# Patient Record
Sex: Male | Born: 1940 | Race: White | Hispanic: No
Health system: Midwestern US, Academic
[De-identification: ages and names within clinical notes are randomized; demographics above are authoritative.]

---

## 2016-07-06 ENCOUNTER — Encounter: Admit: 2016-07-06 | Discharge: 2016-07-06 | Payer: MEDICARE

## 2017-03-11 ENCOUNTER — Encounter: Admit: 2017-03-11 | Discharge: 2017-03-11 | Payer: MEDICARE

## 2017-03-11 MED ORDER — ATORVASTATIN 40 MG PO TAB
ORAL_TABLET | Freq: Every day | 1 refills | Status: AC
Start: 2017-03-11 — End: 2017-09-09

## 2017-07-18 ENCOUNTER — Encounter: Admit: 2017-07-18 | Discharge: 2017-07-18 | Payer: MEDICARE

## 2017-07-18 LAB — COMPREHENSIVE METABOLIC PANEL: Lab: 17 — ABNORMAL HIGH (ref 0–14)

## 2017-08-02 ENCOUNTER — Ambulatory Visit: Admit: 2017-08-02 | Discharge: 2017-08-03 | Payer: MEDICARE

## 2017-08-02 ENCOUNTER — Encounter: Admit: 2017-08-02 | Discharge: 2017-08-02 | Payer: MEDICARE

## 2017-08-02 DIAGNOSIS — I10 Essential (primary) hypertension: ICD-10-CM

## 2017-08-02 DIAGNOSIS — I2510 Atherosclerotic heart disease of native coronary artery without angina pectoris: Principal | ICD-10-CM

## 2017-08-02 DIAGNOSIS — E785 Hyperlipidemia, unspecified: ICD-10-CM

## 2017-08-02 DIAGNOSIS — E119 Type 2 diabetes mellitus without complications: ICD-10-CM

## 2017-08-16 ENCOUNTER — Encounter: Admit: 2017-08-16 | Discharge: 2017-08-16 | Payer: MEDICARE

## 2017-09-09 ENCOUNTER — Encounter: Admit: 2017-09-09 | Discharge: 2017-09-09 | Payer: MEDICARE

## 2017-09-09 MED ORDER — ATORVASTATIN 40 MG PO TAB
ORAL_TABLET | Freq: Every day | 3 refills | Status: AC
Start: 2017-09-09 — End: 2018-09-08

## 2018-03-02 ENCOUNTER — Encounter: Admit: 2018-03-02 | Discharge: 2018-03-02 | Payer: MEDICARE

## 2018-03-02 ENCOUNTER — Ambulatory Visit: Admit: 2018-03-02 | Discharge: 2018-03-02 | Payer: MEDICARE

## 2018-03-02 DIAGNOSIS — I10 Essential (primary) hypertension: Secondary | ICD-10-CM

## 2018-03-02 DIAGNOSIS — I2510 Atherosclerotic heart disease of native coronary artery without angina pectoris: Secondary | ICD-10-CM

## 2018-03-02 DIAGNOSIS — E785 Hyperlipidemia, unspecified: Secondary | ICD-10-CM

## 2018-03-02 DIAGNOSIS — E119 Type 2 diabetes mellitus without complications: Secondary | ICD-10-CM

## 2018-03-02 DIAGNOSIS — E7800 Pure hypercholesterolemia, unspecified: Secondary | ICD-10-CM

## 2018-04-04 ENCOUNTER — Encounter: Admit: 2018-04-04 | Discharge: 2018-04-04 | Payer: MEDICARE

## 2018-07-28 ENCOUNTER — Encounter: Admit: 2018-07-28 | Discharge: 2018-07-28

## 2018-09-07 ENCOUNTER — Encounter: Admit: 2018-09-07 | Discharge: 2018-09-07

## 2018-09-08 ENCOUNTER — Encounter: Admit: 2018-09-08 | Discharge: 2018-09-08

## 2018-09-08 MED ORDER — ATORVASTATIN 40 MG PO TAB
ORAL_TABLET | Freq: Every day | 3 refills | Status: DC
Start: 2018-09-08 — End: 2019-08-13

## 2018-09-12 ENCOUNTER — Ambulatory Visit: Admit: 2018-09-12 | Discharge: 2018-09-13

## 2018-09-12 ENCOUNTER — Encounter: Admit: 2018-09-12 | Discharge: 2018-09-12

## 2018-09-12 DIAGNOSIS — I10 Essential (primary) hypertension: Secondary | ICD-10-CM

## 2018-09-12 DIAGNOSIS — E785 Hyperlipidemia, unspecified: Secondary | ICD-10-CM

## 2018-09-12 DIAGNOSIS — I2510 Atherosclerotic heart disease of native coronary artery without angina pectoris: Secondary | ICD-10-CM

## 2018-09-12 DIAGNOSIS — E119 Type 2 diabetes mellitus without complications: Secondary | ICD-10-CM

## 2018-09-12 MED ORDER — AMLODIPINE 2.5 MG PO TAB
2.5 mg | ORAL_TABLET | Freq: Every day | ORAL | 3 refills | Status: DC
Start: 2018-09-12 — End: 2019-08-13

## 2018-09-12 NOTE — Progress Notes
Date of Service: 09/12/2018    Parker Ramos is a 78 y.o. male.       HPI     [name] is followed for coronary artery disease, hypertension, diabetes mellitus and hyperlipidemia.??????His blood pressure has been well controlled in the past although I do not believe that he has been monitoring it recently.  COVID-19 pandemic and the political unrest has been stressful for him recently.  His greatest complaints???continue to be arthritic and consist of bilateral hip pain, chronic lower back discomfort???and discomfort in his right knee. He has seen pain therapy at [facility] and went through a course of warm water therapy without much benefit.??????[name] has not returned to the pain management clinic for follow-up care. ??????He reports that he can walk approximately 100 yards???at a time slowly. ???When he looks at his digital watch he says he walks a total of 1-1.5 miles daily.??????Still it is apparent that he has difficulty transferring and ambulating. ???From a cardiovascular perspective, the patient reports that he has been stable and he reports no angina, current congestive symptoms, palpitations, sensation of sustained forceful heart pounding, lightheadedness or syncope. The patient reports no myalgias or bleeding abnormalities. His blood sugars vary with his diet. ???He does not believe that he has diabetic neuropathy, retinopathy or nephropathy.???  ???  Historically, in the fall of 2007 [name] initially presented with symptoms of lightheadedness and a rapid heart beat. A stress test was performed and   was abnormal. He then underwent coronary angiography which revealed severe three-vessel coronary artery disease. He   underwent cardiac bypass surgery receiving a left internal mammary artery graft to the left anterior descending along   with a free right internal mammary artery T-graft off the LIMA to the diagonal and obtuse marginal branch of the   circumflex coronary artery. He also received a reverse saphenous vein graft to the posterior descending branch of the   right coronary artery. This was performed on November 08, 2005. The surgery was tolerated well and was uncomplicated. ???  In 2010 [name] underwent cardiac evaluation prior to left knee surgery. Coronary angiography was performed on 06/18/08 and revealed: ???  1. Severe multivessel native coronary artery disease.  2. Patent saphenous vein graft to PDA.  3. Patent LIMA to LAD with patent jump segments to the diagonal and obtuse marginal.  4. Normal left ventricular systolic function.     [name] developed left facial numbness rather abruptly on 06/07/11 at approximately 10:45 AM. It lasted until approximately 3:30 PM and it was not associated with motor abnormalities, visual abnormalities, hearing abnormalities, cognitive abnormalities or difficulty with speech. The patient reports that a CT scan, carotid duplex and MRA were performed and that a transient ischemic attack was suspected. He reports that he was seen by a neurologist who placed him on Plavix instead of aspirin. 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 He reports that he developed dyspnea on 04/14/12 and was seen in the Emergency Room. He was noted to have pulmonary vascular congestion and reportedly was administered IV furosemide with improvement and his hydrochlorothiazide was restarted. [name] does have a history of diabetes mellitus and transient ischemic attack.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 He reports that he developed hypotension after receiving Flomax and spent a night in the intensive care unit but reports no other complications such as chest discomfort, congestive heart failure or arrhythmias. On July 16, 2017  [name] woke up with mid abdominal pain and nausea. ???He had occasional episodes of emesis. ???The patient???reports that he was evaluated on 07/18/2017 and was sent to the emergency room. ???There is no evidence for an acute coronary syndrome. ???The source of the discomfort was thought to be abdominal and he was placed on an H2 blocker (famotidine) with some gradual improvement.          Vitals:    09/12/18 1014 09/12/18 1022   BP: (!) 182/84 (!) 178/82   BP Source: Arm, Left Upper Arm, Right Upper   Pulse: 72    Temp: 36.2 ???C (97.2 ???F)    SpO2: 98%    Weight: 89.8 kg (198 lb)    Height: 1.727 m (5' 8)    PainSc: Zero      Body mass index is 30.11 kg/m???.     Past Medical History  Patient Active Problem List    Diagnosis Date Noted   ??? 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 Polyarthralgia 09/01/2014   ??? TIA (transient ischemic attack) 08/04/2011   ??? Palpitation 08/15/2008     Looping Event Monitor:  23 transmissions with no symptoms reported.  It was either a baseline recording or random recording.  The patient's monitor demonstrated sinus rhythm with sinus bradycardia and frequent PVCs of different morphology with, at times, couplets or bigeminal rhythm.  There also is separate ventricular beats noted at a rate of about 40-44 beats per minute.  These were followed by PVCs and it appears to be competing with the sinus rhythm, maybe due to increased refractory period after the PVC.  No symptoms reported during these beats.     ??? CAD (coronary artery disease) 06/17/2008     A.  11/02/2005 adenosine thallium stress test abnormal with reversible apical anterior perfusion abnormality. Abnormal ECG.  EF 60%.  b.     11/05/2005 cardiac cath left main normal.  LAD tubular 60 percent proximal stenosis and first diagonal 80 percent ostial stenosis.  Left   circumflex 80 percent ostial stenosis followed by 30 percent mid stenosis.  RCA mid 70 percent stenosis. PLV moderate stenosis.  EF  55 percent.  Mild apical lateral hypokinesis.  c.     11/08/2005 coronary artery bypass with LIMA to LAD, a free RIMA as T-graft off LIMA to diagonal, and then sequential to obtuse marginal branch of circumflex. Separate SVG to right PDA.  D. 06/06/08: Lexicon stress Spect- [facility]: mild intensity ischemia involving portions of the [place] anterolateral and lateral wall. EF 55%  E. 06/20/08: Cardiac cath at [place]: LM- mild plaque, LAD- Prox-mid 80-99%, LCx: 80 % proximal/ostial circumflex, which spans the takeoff of the first  small [place] obtuse marginal branch. 60 % midportion of the circumflex. RCA: 100 % occluded distally, diffuse up to 50 % stenosis in the  proximal and mid segments of RCA.EF 55%. Patent LIMA to LAD, Patent SVG to PDA. EDP 14-16 mm Hg     ??? 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       Separate SVG to right PDA.       ??? Type II diabetes mellitus (HCC) 06/17/2008   ??? HTN (hypertension) 06/17/2008   ??? HLD (hyperlipidemia) 06/17/2008  Review of Systems   Constitution: Negative.   HENT: Negative.    Eyes: Negative.    Cardiovascular: Negative.    Respiratory: Negative.    Endocrine: Negative.    Hematologic/Lymphatic: Negative.    Skin: Negative.    Musculoskeletal: Negative.    Gastrointestinal: Negative.    Genitourinary: Negative.    Neurological: Negative.    Psychiatric/Behavioral: Negative.    Allergic/Immunologic: Negative.        Physical Exam  GENERAL: The patient is well developed, well nourished, resting comfortably and in no distress. ???  HEENT: No abnormalities of the visible oro-nasopharynx, conjunctiva or sclera are noted. ???  NECK: There is no jugular venous distension. Carotids are palpable and without bruits. There is no thyroid enlargement. ???  Chest: Lung fields are clear to auscultation. There are no wheezes or crackles. ???  CV: There is a regular rhythm. The first and second heart sounds are normal. There are no murmurs, gallops or rubs. His heart rate is 68???BPM. ???I took his blood pressure myself and it was 160/70 mmHg in his left arm seated after he had been sitting for a while.  ABD: The abdomen is soft and supple with normal bowel sounds. There is no hepatosplenomegaly, ascites, tenderness, masses or bruits. ???  Neuro: There are no focal motor defects. Ambulation is slow. Arthritic discomfort makes transferring slow. Cognitive function appears normal. ???  Ext:???There is no edema or evidence of deep vein thrombosis    Cardiovascular Studies  A 12-lead ECG was obtained on 12/2018 shows normal sinus rhythm with a heart rate of 67 bpm.  There is no evidence for myocardial ischemia or infarction.  Labs from 07/04/2018 revealed serum potassium 4.8 mmol/L and serum creatinine 0.89 mg/dL.  His ALT = 16.  His total cholesterol was 105, triglycerides 54, HDL 38 and LDL cholesterol 56 mg/dL.    Problems Addressed Today  Coronary artery disease.  Hypertension.  Hypercholesterolemia.  Assessment and Plan     [name] appears stable from a cardiovascular perspective. His blood pressure was elevated today,  and I do not believe that he has been monitoring his blood pressure at home most recently.   Alternatives for the treatment of hypertension were reviewed with the patient and he wanted to start amlodipine 2.5 mg daily in addition to more intense salt restriction.  Possible adverse effects associated with amlodipine were extensively reviewed with the patient.  I have asked the patient to keep a log book of his BP readings and to report systolic BP readings exceeding 130 mm Hg.  I have asked him to report his blood pressure readings to our office in 1 month regardless. Regular mild aerobic exercise, weight loss and adherence to a heart healthy diabetic diet were recommended. I have asked the patient to carry sublingual nitroglycerin at all times. ???I have asked him to work on diabetic dietary compliance and diabetic control.???He indicates that he obtains his labs from his primary care physician. I have asked [name] to return for follow-up in???3 months.         Current Medications (including today's revisions)  ??? amLODIPine (NORVASC) 2.5 mg tablet Take one tablet by mouth daily.   ??? amoxicillin (AMOXIL) 500 mg capsule TAKE 4 CAPSULES BY MOUTH 1 HOUR PRIOR TO DENTAL PROCEDURE   ??? atorvastatin (LIPITOR) 40 mg tablet TAKE 1 TABLET EVERY DAY   ??? CALCIUM CARBONATE/VITAMIN D3 (CALCIUM + D PO) Take 1 Tab by mouth Daily.   ??? clopiDOGrel (PLAVIX) 75  mg tablet Take 75 mg by mouth daily.     ??? famotidine(+) (PEPCID) 40 mg tablet Take 40 mg by mouth daily.   ??? glimepiride (AMARYL) 1 mg tablet Take 1 mg by mouth twice daily.   ??? levothyroxine (SYNTHROID) 25 mcg tablet Take 25 mcg by mouth daily.   ??? metformin (GLUCOPHAGE) 1,000 mg tablet Take 1 Tab by mouth Twice Daily With Meals. DO NOT RESUME UNTIL THE EVENING OF MAY 20TH   ??? metoprolol (LOPRESSOR) 25 mg tablet Take 0.5 Tabs by mouth Twice Daily.   ??? nitroglycerin (NITROSTAT) 0.4 mg tablet Place 1 Tab under tongue every 5 minutes as needed for Chest Pain.

## 2018-09-12 NOTE — Patient Instructions
It was nice to see you today.      We discussed:   Please call our office in a couple weeks with your blood pressure readings.  Our nursing number is [phone]

## 2018-09-29 ENCOUNTER — Encounter: Admit: 2018-09-29 | Discharge: 2018-09-29

## 2018-09-29 NOTE — Telephone Encounter
Pt called to report BP readings.  BP Readings are as follows:    161/91, 143/84  122/67, 133/70  110/61, 108/57  <after starting medication>  137/72  132/59  149/72  132/68  133/71  134/70  127/74  139/73  126/71  129/69  126/70  118/69  124/71  134/78  129/66  115/64  111/63    Pt states that he is feeling okay.  He states he still has aches and pains.  He denies any chest pain, shortness of breath, headache or palpitations.  Pt confirmed he is taking medication as prescribed.    Will route to Dr. Jacklyn Shell for any additional recommendations.

## 2018-10-03 NOTE — Telephone Encounter
Called and discussed recommendations with pt's daughter.  She verbalized understanding.  No further needs identified.  Will callback with any questions, concerns or problems.

## 2018-10-03 NOTE — Telephone Encounter
Parker Massed, MD  Baldwin Crown, RN; Pat Kocher, RN    Caller: Unspecified (4 days ago, 3:47 PM)              To all: Blood pressure readings look improved. Please let him know. Thanks. SBG

## 2018-12-19 ENCOUNTER — Encounter: Admit: 2018-12-19 | Discharge: 2018-12-19 | Payer: MEDICARE

## 2018-12-19 DIAGNOSIS — I2510 Atherosclerotic heart disease of native coronary artery without angina pectoris: Secondary | ICD-10-CM

## 2018-12-19 DIAGNOSIS — E785 Hyperlipidemia, unspecified: Secondary | ICD-10-CM

## 2018-12-19 DIAGNOSIS — I10 Essential (primary) hypertension: Secondary | ICD-10-CM

## 2018-12-19 DIAGNOSIS — E119 Type 2 diabetes mellitus without complications: Secondary | ICD-10-CM

## 2018-12-19 NOTE — Progress Notes
Date of Service: 12/19/2018    Parker Ramos is a 78 y.o. male.       HPI     Parker Ramos is followed for coronary artery disease, hypertension, diabetes mellitus and hyperlipidemia.??When I saw him in the office on September 12, 2018 his blood pressure was quite elevated because he was being upset by the COVID-19 pandemic and associated political unrest.  Amlodipine 2.5 mg daily was added to his medical regimen and he has tolerated this without difficulty.  However, he is still not been monitoring his blood pressures. His greatest complaints?continue to be arthritic and consist of bilateral hip pain, chronic lower back discomfort?and discomfort in his right knee. He has seen pain therapy at [facility] and went through a course of warm water therapy without much benefit.??Parker Ramos has not returned to the pain management clinic for follow-up care. ??He reports that he can walk approximately 100 yards?at a time slowly. ?When he looks at his digital watch he says he walks a total of 1-1.5 miles daily.??Still it is apparent that he has difficulty transferring and ambulating. ?From a cardiovascular perspective, the patient reports that he has been stable and he reports no angina, current congestive symptoms, palpitations, sensation of sustained forceful heart pounding, lightheadedness or syncope. The patient reports no myalgias or bleeding abnormalities. His blood sugars vary with his diet.??He does not believe that he has diabetic neuropathy, retinopathy or nephropathy.?  ?  Historically, in the fall of 2007 Parker Ramos initially presented with symptoms of lightheadedness and a rapid heart beat. A stress test was performed and   was abnormal. He then underwent coronary angiography which revealed severe three-vessel coronary artery disease. He   underwent cardiac bypass surgery receiving a left internal mammary artery graft to the left anterior descending along with a free right internal mammary artery T-graft off the LIMA to the diagonal and obtuse marginal branch of the   circumflex coronary artery. He also received a reverse saphenous vein graft to the posterior descending branch of the   right coronary artery. This was performed on November 08, 2005. The surgery was tolerated well and was uncomplicated. ?  In 2010 Parker Ramos underwent cardiac evaluation prior to left knee surgery. Coronary angiography was performed on 06/18/08 and revealed: ?  1. Severe multivessel native coronary artery disease.  2. Patent saphenous vein graft to PDA.  3. Patent LIMA to LAD with patent jump segments to the diagonal and obtuse marginal.  4. Normal left ventricular systolic function.   ? Parker Ramos developed left facial numbness rather abruptly on 06/07/11 at approximately 10:45 AM. It lasted until approximately 3:30 PM and it was not associated with motor abnormalities, visual abnormalities, hearing abnormalities, cognitive abnormalities or difficulty with speech. The patient reports that a CT scan, carotid duplex and MRA were performed and that a transient ischemic attack was suspected. He reports that he was seen by a neurologist who placed him on Plavix instead of aspirin. 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 He reports that he developed dyspnea on 04/14/12 and was seen in the Emergency Room. He was noted to have pulmonary vascular congestion and reportedly was administered IV furosemide with improvement and his hydrochlorothiazide was restarted. 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 He reports that he developed hypotension after receiving Flomax and spent a night in the intensive care  unit but reports no other complications such as chest discomfort, congestive heart failure or arrhythmias.?On?July 16, 2017?Parker Ramos?woke up with mid abdominal pain and nausea. ?He had occasional episodes of emesis. ?The patient?reports that he was evaluated on 07/18/2017 and was sent to the emergency room. ?There is no evidence for an acute coronary syndrome. ?The source of the discomfort was thought to be abdominal and he was placed on an H2 blocker (famotidine) with some gradual improvement.?         Vitals:    12/19/18 0956   BP: 138/80   BP Source: Arm, Left Upper   Pulse: 68   Temp: 36.6 ?C (97.9 ?F)   SpO2: 98%   Weight: 90.4 kg (199 lb 3.2 oz)   Height: 1.727 m (5' 8)   PainSc: Zero     Body mass index is 30.29 kg/m?Marland Kitchen     Past Medical History  Patient Active Problem List    Diagnosis Date Noted ? 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 Polyarthralgia 09/01/2014   ? TIA (transient ischemic attack) 08/04/2011   ? Palpitation 08/15/2008     Looping Event Monitor:  23 transmissions with no symptoms reported.  It was either a baseline recording or random recording.  The patient's monitor demonstrated sinus rhythm with sinus bradycardia and frequent PVCs of different morphology with, at times, couplets or bigeminal rhythm.  There also is separate ventricular beats noted at a rate of about 40-44 beats per minute.  These were followed by PVCs and it appears to be competing with the sinus rhythm, maybe due to increased refractory period after the PVC.  No symptoms reported during these beats.     ? CAD (coronary artery disease) 06/17/2008     A.  11/02/2005 adenosine thallium stress test abnormal with reversible apical anterior perfusion abnormality. Abnormal ECG.  EF 60%.  b.     11/05/2005 cardiac cath left main normal.  LAD tubular 60 percent proximal stenosis and first diagonal 80 percent ostial stenosis.  Left   circumflex 80 percent ostial stenosis followed by 30 percent mid stenosis.  RCA mid 70 percent stenosis. PLV moderate stenosis.  EF  55 percent.  Mild apical lateral hypokinesis.  c.     11/08/2005 coronary artery bypass with LIMA to LAD, a free RIMA as T-graft off LIMA to diagonal, and then sequential to obtuse marginal  branch of circumflex. Separate SVG to right PDA.  D. 06/06/08: [facility]icon stress Spect- [facility]: mild intensity ischemia involving portions of the [place] anterolateral and lateral wall. EF 55% E. 06/20/08: Cardiac cath at [place]: LM- mild plaque, LAD- Prox-mid 80-99%, LCx: 80 % proximal/ostial circumflex, which spans the takeoff of the first  small [place] obtuse marginal branch. 60 % midportion of the circumflex. RCA: 100 % occluded distally, diffuse up to 50 % stenosis in the  proximal and mid segments of RCA.EF 55%. Patent LIMA to LAD, Patent SVG to PDA. EDP 14-16 mm Hg     ? 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       Separate SVG to right PDA.       ? Type II diabetes mellitus (HCC) 06/17/2008   ? HTN (hypertension) 06/17/2008   ? HLD (hyperlipidemia) 06/17/2008  Review of Systems   Constitution: Negative.   HENT: Negative.    Eyes: Negative.    Cardiovascular: Positive for leg swelling.   Respiratory: Negative.    Endocrine: Negative.    Hematologic/Lymphatic: Negative.    Skin: Negative.    Musculoskeletal: Positive for arthritis, back pain and joint pain.   Gastrointestinal: Negative.    Genitourinary: Negative.    Neurological: Positive for dizziness and loss of balance.   Psychiatric/Behavioral: Negative.    Allergic/Immunologic: Negative.        Physical Exam  GENERAL: The patient is well developed, well nourished, resting comfortably and in no distress.   HEENT: No abnormalities of the visible oro-nasopharynx, conjunctiva or sclera are noted.  NECK: There is no jugular venous distension. Carotids are palpable and without bruits. There is no thyroid enlargement.  Chest: Lung fields are clear to auscultation. There are no wheezes or crackles.  CV: There is a regular rhythm. The first and second heart sounds are normal. There are no murmurs, gallops or rubs.  ABD: The abdomen is soft and supple with normal bowel sounds. There is no hepatosplenomegaly, ascites, tenderness, masses or bruits. Neuro: There are no focal motor defects. Ambulation is normal. Cognitive function appears normal.  Ext: There is no edema or evidence of deep vein thrombosis. Peripheral pulses are satisfactory.    SKIN: There are no rashes and no cellulitis  PSYCH: The patient is calm, rationale and oriented.    Cardiovascular Studies  A 12-lead ECG was obtained on 09/12/2018 shows normal sinus rhythm with a heart rate of 67 bpm.  There is no evidence for myocardial ischemia or infarction.  A twelve-lead ECG obtained 12/19/2018 shows normal sinus rhythm with a heart rate of 61 bpm.  There is no evidence of myocardial ischemia or infarction.  Labs from 07/04/2018 revealed serum potassium 4.8 mmol/L and serum creatinine 0.89 mg/dL.  His ALT = 16.  His total cholesterol was 105, triglycerides 54, HDL 38 and LDL cholesterol 56 mg/dL.     Problems Addressed Today  Coronary artery disease.  Hypertension.  Hypercholesterolemia.  Assessment and Plan  Parker Ramos appears stable from a cardiovascular perspective.?His blood pressure appears much better controlled he has been trying to avoid arguments with people concerning Covid and politics.  I have asked the patient to keep a log book of his BP readings and to report BP readings exceeding 130/80?mm Hg. Regular mild aerobic exercise, weight loss and adherence to a heart healthy diabetic diet were recommended. I have asked the patient to carry sublingual nitroglycerin at all times. ?I have asked him to work on diabetic dietary compliance and diabetic control.?He indicates that he obtains his labs from his primary care physician. I have asked Parker Ramos to return for follow-up in?6 months.         Current Medications (including today's revisions)  ? amLODIPine (NORVASC) 2.5 mg tablet Take one tablet by mouth daily.   ? amoxicillin (AMOXIL) 500 mg capsule TAKE 4 CAPSULES BY MOUTH 1 HOUR PRIOR TO DENTAL PROCEDURE   ? atorvastatin (LIPITOR) 40 mg tablet TAKE 1 TABLET EVERY DAY ? CALCIUM CARBONATE/VITAMIN D3 (CALCIUM + D PO) Take 1 Tab by mouth Daily.   ? clopiDOGrel (PLAVIX) 75 mg tablet Take 75 mg by mouth daily.     ? famotidine(+) (PEPCID) 40 mg tablet Take 40 mg by mouth daily.   ? glimepiride (AMARYL) 1 mg tablet Take 2 mg by mouth twice daily.   ? levothyroxine (SYNTHROID) 25 mcg  tablet Take 25 mcg by mouth daily.   ? metformin (GLUCOPHAGE) 1,000 mg tablet Take 1 Tab by mouth Twice Daily With Meals. DO NOT RESUME UNTIL THE EVENING OF MAY 20TH   ? metoprolol (LOPRESSOR) 25 mg tablet Take 0.5 Tabs by mouth Twice Daily.   ? nitroglycerin (NITROSTAT) 0.4 mg tablet Place 1 Tab under tongue every 5 minutes as needed for Chest Pain.

## 2019-06-12 ENCOUNTER — Encounter: Admit: 2019-06-12 | Discharge: 2019-06-12 | Payer: MEDICARE

## 2019-06-12 DIAGNOSIS — E785 Hyperlipidemia, unspecified: Secondary | ICD-10-CM

## 2019-06-12 DIAGNOSIS — I2510 Atherosclerotic heart disease of native coronary artery without angina pectoris: Secondary | ICD-10-CM

## 2019-06-12 DIAGNOSIS — I10 Essential (primary) hypertension: Secondary | ICD-10-CM

## 2019-06-12 DIAGNOSIS — E119 Type 2 diabetes mellitus without complications: Secondary | ICD-10-CM

## 2019-06-12 NOTE — Patient Instructions
Follow up as directed.  Call sooner if issues.  Call the [facility] nursing line at [phone].  Leave a detailed message for the nurse in [facility] with how we can assist you and we will call you back.

## 2019-06-12 NOTE — Progress Notes
Date of Service: 06/12/2019    Parker Ramos is a 79 y.o. male.       HPI     [name] is followed for coronary artery disease, hypertension, diabetes mellitus and hyperlipidemia.  His blood pressure tends to increase when he is upset, especially about politics.  Over the past 4 months he has been less upset by politics and his blood pressure has been much better controlled.  Almost all of his current symptoms are related to arthritis.  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  He has seen pain therapy at [facility] and went through a course of warm water therapy without much benefit.??[name] has not returned to the pain management clinic for follow-up care. ??He reports that he can walk approximately 100 yards?at a time?slowly.  It is apparent that he has difficulty transferring and ambulating. ?From a cardiovascular perspective, the patient reports that he has been stable and he reports no angina, current congestive symptoms, palpitations, sensation of sustained forceful heart pounding, lightheadedness or syncope. The patient reports no myalgias or bleeding abnormalities. His blood sugars vary with his diet.??He does not believe that he has diabetic neuropathy, retinopathy or nephropathy.?  ?  Historically, in the fall of 2007 [name] initially presented with symptoms of lightheadedness and a rapid heart beat. A stress test was performed and   was abnormal. He then underwent coronary angiography which revealed severe three-vessel coronary artery disease. He   underwent cardiac bypass surgery receiving a left internal mammary artery graft to the left anterior descending along   with a free right internal mammary artery T-graft off the LIMA to the diagonal and obtuse marginal branch of the   circumflex coronary artery. He also received a reverse saphenous vein graft to the posterior descending branch of the   right coronary artery. This was performed on November 08, 2005. The surgery was tolerated well and was uncomplicated. ?  In 2010 [name] underwent cardiac evaluation prior to left knee surgery. Coronary angiography was performed on 06/18/08 and revealed: ?  1. Severe multivessel native coronary artery disease.  2. Patent saphenous vein graft to PDA.  3. Patent LIMA to LAD with patent jump segments to the diagonal and obtuse marginal.  4. Normal left ventricular systolic function.   ?  [name] developed left facial numbness rather abruptly on 06/07/11 at approximately 10:45 AM. It lasted until approximately 3:30 PM and it was not associated with motor abnormalities, visual abnormalities, hearing abnormalities, cognitive abnormalities or difficulty with speech. The patient reports that a CT scan, carotid duplex and MRA were performed and that a transient ischemic attack was suspected. He reports that he was seen by a neurologist who placed him on Plavix instead of aspirin. 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 He reports that he developed dyspnea on 04/14/12 and was seen in the Emergency Room. He was noted to have pulmonary vascular congestion and reportedly was administered IV furosemide with improvement and his hydrochlorothiazide was restarted. [name] does have a history of diabetes mellitus and transient ischemic attack.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 He reports that he developed hypotension after receiving Flomax and spent a night in the intensive care unit but reports no other complications such as chest discomfort, congestive heart failure or arrhythmias.?On?July 16, 2017?[name]?woke up with mid abdominal pain and nausea. ?He had occasional episodes of emesis. ?The patient?reports that  he was evaluated on 07/18/2017 and was sent to the emergency room. ?There is no evidence for an acute coronary syndrome. ?The source of the discomfort was thought to be abdominal and he was placed on an H2 blocker (famotidine) with some gradual improvement.?         Vitals:    06/12/19 1351 06/12/19 1406   BP: 130/68 120/72   BP Source: Arm, Left Upper Arm, Right Upper   Patient Position: Sitting Sitting   Pulse: 74    SpO2: 98%    Weight: 90.4 kg (199 lb 6.4 oz)    Height: 1.727 m (5' 8)    PainSc: Six      Body mass index is 30.32 kg/m?Marland Kitchen     Past Medical History  Patient Active Problem List    Diagnosis Date Noted   ? 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 Polyarthralgia 09/01/2014   ? TIA (transient ischemic attack) 08/04/2011   ? Palpitation 08/15/2008     Looping Event Monitor:  23 transmissions with no symptoms reported.  It was either a baseline recording or random recording.  The patient's monitor demonstrated sinus rhythm with sinus bradycardia and frequent PVCs of different morphology with, at times, couplets or bigeminal rhythm.  There also is separate ventricular beats noted at a rate of about 40-44 beats per minute.  These were followed by PVCs and it appears to be competing with the sinus rhythm, maybe due to increased refractory period after the PVC.  No symptoms reported during these beats.     ? CAD (coronary artery disease) 06/17/2008     A.  11/02/2005 adenosine thallium stress test abnormal with reversible apical anterior perfusion abnormality. Abnormal ECG.  EF 60%.  b.     11/05/2005 cardiac cath left main normal.  LAD tubular 60 percent proximal stenosis and first diagonal 80 percent ostial stenosis.  Left   circumflex 80 percent ostial stenosis followed by 30 percent mid stenosis.  RCA mid 70 percent stenosis. PLV moderate stenosis.  EF  55 percent.  Mild apical lateral hypokinesis.  c.     11/08/2005 coronary artery bypass with LIMA to LAD, a free RIMA as T-graft off LIMA to diagonal, and then sequential to obtuse marginal  branch of circumflex. Separate SVG to right PDA.  D. 06/06/08: Lexicon stress Spect- [facility]: mild intensity ischemia involving portions of the [place] anterolateral and lateral wall. EF 55%  E. 06/20/08: Cardiac cath at [facility]: LM- mild plaque, LAD- Prox-mid 80-99%, LCx: 80 % proximal/ostial circumflex, which spans the takeoff of the first  small [place] obtuse marginal branch. 60 % midportion of the circumflex. RCA: 100 % occluded distally, diffuse up to 50 % stenosis in the  proximal and mid segments of RCA.EF 55%. Patent LIMA to LAD, Patent SVG to PDA. EDP 14-16 mm Hg     ? 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       Separate SVG to right PDA.       ? Type II diabetes mellitus (HCC) 06/17/2008   ? HTN (hypertension) 06/17/2008   ? HLD (hyperlipidemia) 06/17/2008         Review of Systems   Constitution: Negative.   HENT: Negative.    Eyes: Positive  for blurred vision and vision loss in right eye.   Cardiovascular: Positive for dyspnea on exertion.   Respiratory: Negative.    Endocrine: Negative.    Hematologic/Lymphatic: Negative.    Skin: Negative.    Musculoskeletal: Positive for arthritis, joint pain and muscle weakness.   Gastrointestinal: Negative.    Genitourinary: Negative.    Neurological: Positive for light-headedness and weakness.   Psychiatric/Behavioral: Negative.    Allergic/Immunologic: Negative.        Physical Exam  GENERAL: The patient is well developed, well nourished, resting comfortably and in no distress.   HEENT: No abnormalities of the visible oro-nasopharynx, conjunctiva or sclera are noted.  NECK: There is no jugular venous distension. Carotids are palpable and without bruits. There is no thyroid enlargement.  Chest: Lung fields are clear to auscultation. There are no wheezes or crackles.  CV: There is a regular rhythm. The first and second heart sounds are normal. There are no murmurs, gallops or rubs.  ABD: The abdomen is soft and supple with normal bowel sounds. There is no hepatosplenomegaly, ascites, tenderness, masses or bruits.  Neuro: There are no focal motor defects. Ambulation is slow and stooped.  Cognitive function appears normal.  He has difficulty transferring due to arthritis in multiple joints.  Ext: There is no edema or evidence of deep vein thrombosis. Peripheral pulses are satisfactory.    SKIN: There are no rashes and no cellulitis  PSYCH: The patient is calm, rationale and oriented.    Cardiovascular Studies  A 12-lead ECG was obtained on 09/12/2018 shows normal sinus rhythm with a heart rate of 67 bpm. ?There is no evidence for myocardial ischemia or infarction.  A twelve-lead ECG obtained 12/19/2018 shows normal sinus rhythm with a heart rate of 61 bpm.  There is no evidence of myocardial ischemia or infarction.  Labs from 07/04/2018 revealed serum potassium 4.8 mmol/L and serum creatinine 0.89 mg/dL. ?His ALT = 16. ?His total cholesterol was 105, triglycerides 54, HDL 38 and LDL cholesterol 56 mg/dL.    Problems Addressed Today  Encounter Diagnoses   Name Primary?   ? Coronary artery disease involving native heart without angina pectoris, unspecified vessel or lesion type Yes   ? Essential hypertension        Assessment and Plan     [name] appears stable from a cardiovascular perspective.?His blood pressure appears much better controlled he has been trying to avoid arguments with people concerning Covid and politics.  I have asked the patient to keep a log book of his BP readings and to report BP readings exceeding 130/80?mm Hg. Regular mild aerobic exercise, weight loss and adherence to a heart healthy diabetic diet were recommended. I have asked the patient to carry sublingual nitroglycerin at all times. ?I have asked him to work on diabetic dietary compliance and diabetic control.?He indicates that he obtains his labs from his primary care physician.  I urged him to return to the spine center and to undergo further arthritic evaluation, which he politely declined.  I have asked [name] to return for follow-up in?6?months.         Current Medications (including today's revisions)  ? acetaminophen SR (TYLENOL) 650 mg tablet Take 650 mg by mouth every 6 hours as needed for Pain.   ? amLODIPine (NORVASC) 2.5 mg tablet Take one tablet by mouth daily.   ? amoxicillin (AMOXIL) 500 mg capsule TAKE 4 CAPSULES BY MOUTH 1 HOUR PRIOR TO DENTAL PROCEDURE   ? atorvastatin (LIPITOR)  40 mg tablet TAKE 1 TABLET EVERY DAY   ? CALCIUM CARBONATE/VITAMIN D3 (CALCIUM + D PO) Take 1 tablet by mouth twice daily.   ? clopiDOGrel (PLAVIX) 75 mg tablet Take 75 mg by mouth daily.     ? famotidine(+) (PEPCID) 40 mg tablet Take 40 mg by mouth daily.   ? glimepiride (AMARYL) 2 mg tablet Take 1 tablet by mouth twice daily.   ? levothyroxine (SYNTHROID) 25 mcg tablet Take 25 mcg by mouth daily.   ? metformin (GLUCOPHAGE) 1,000 mg tablet Take 1 Tab by mouth Twice Daily With Meals. DO NOT RESUME UNTIL THE EVENING OF MAY 20TH   ? metoprolol (LOPRESSOR) 25 mg tablet Take 0.5 Tabs by mouth Twice Daily.   ? nitroglycerin (NITROSTAT) 0.4 mg tablet Place 1 Tab under tongue every 5 minutes as needed for Chest Pain.   ? traMADoL (ULTRAM) 50 mg tablet Take 1 tablet by mouth daily.

## 2019-08-13 ENCOUNTER — Encounter: Admit: 2019-08-13 | Discharge: 2019-08-13 | Payer: MEDICARE

## 2019-08-13 MED ORDER — ATORVASTATIN 40 MG PO TAB
ORAL_TABLET | Freq: Every day | 3 refills | Status: AC
Start: 2019-08-13 — End: ?

## 2019-08-13 MED ORDER — AMLODIPINE 2.5 MG PO TAB
ORAL_TABLET | Freq: Every day | 3 refills | Status: AC
Start: 2019-08-13 — End: ?

## 2019-12-11 ENCOUNTER — Encounter: Admit: 2019-12-11 | Discharge: 2019-12-11 | Payer: MEDICARE

## 2019-12-11 DIAGNOSIS — I2510 Atherosclerotic heart disease of native coronary artery without angina pectoris: Secondary | ICD-10-CM

## 2019-12-11 DIAGNOSIS — E119 Type 2 diabetes mellitus without complications: Secondary | ICD-10-CM

## 2019-12-11 DIAGNOSIS — I10 Essential (primary) hypertension: Secondary | ICD-10-CM

## 2019-12-11 DIAGNOSIS — E785 Hyperlipidemia, unspecified: Secondary | ICD-10-CM

## 2019-12-11 DIAGNOSIS — M1990 Unspecified osteoarthritis, unspecified site: Secondary | ICD-10-CM

## 2019-12-11 NOTE — Progress Notes
Date of Service: 12/11/2019    ZURI MERRIMAN is a 79 y.o. male.       HPI     Mr. Joven Smeby is followed for coronary artery disease, hypertension, diabetes mellitus and hyperlipidemia.    His blood pressure has not been well controlled in recent months. Almost all of his current symptoms are related to arthritis.  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  He has seen pain therapy at [facility] and went through a course of warm water therapy without much benefit.??Mr. Moreaux has not returned to the pain management clinic for follow-up care. ??His exercise tolerance is slowly worsening due to his arthritis.  It is apparent that he has difficulty transferring and ambulating. ?From a cardiovascular perspective, the patient reports that he has been stable and he reports no angina, current congestive symptoms, palpitations, sensation of sustained forceful heart pounding, lightheadedness or syncope. The patient reports no myalgias or bleeding abnormalities. His blood sugars vary with his diet.??He does not believe that he has diabetic neuropathy, retinopathy or nephropathy.?  ?  Historically, in the fall of 2007 Mr. Liggins initially presented with symptoms of lightheadedness and a rapid heart beat. A stress test was performed and   was abnormal. He then underwent coronary angiography which revealed severe three-vessel coronary artery disease. He   underwent cardiac bypass surgery receiving a left internal mammary artery graft to the left anterior descending along   with a free right internal mammary artery T-graft off the LIMA to the diagonal and obtuse marginal branch of the   circumflex coronary artery. He also received a reverse saphenous vein graft to the posterior descending branch of the   right coronary artery. This was performed on November 08, 2005. The surgery was tolerated well and was uncomplicated. ?  In 2010 Mr. Moon underwent cardiac evaluation prior to left knee surgery. Coronary angiography was performed on 06/18/08 and revealed: ?  1. Severe multivessel native coronary artery disease.  2. Patent saphenous vein graft to PDA.  3. Patent LIMA to LAD with patent jump segments to the diagonal and obtuse marginal.  4. Normal left ventricular systolic function.   ?  Mr. Zuehlke developed left facial numbness rather abruptly on 06/07/11 at approximately 10:45 AM. It lasted until approximately 3:30 PM and it was not associated with motor abnormalities, visual abnormalities, hearing abnormalities, cognitive abnormalities or difficulty with speech. The patient reports that a CT scan, carotid duplex and MRA were performed and that a transient ischemic attack was suspected. He reports that he was seen by a neurologist who placed him on Plavix instead of aspirin. Mr. Mirchandani reports that he underwent total right knee arthroplasty on 02/29/12. He reports that he developed dyspnea on 04/14/12 and was seen in the Emergency Room. He was noted to have pulmonary vascular congestion and reportedly was administered IV furosemide with improvement and his hydrochlorothiazide was restarted. Mr. Huckleby does have a history of diabetes mellitus and transient ischemic attack.Mr. Pianka reports that he underwent right total knee arthroplasty on 06/20/13. He reports that he developed hypotension after receiving Flomax and spent a night in the intensive care unit but reports no other complications such as chest discomfort, congestive heart failure or arrhythmias.?On?July 16, 2017?Mr. Capers?woke up with mid abdominal pain and nausea. ?He had occasional episodes of emesis. ?The patient?reports that he was evaluated on 07/18/2017 and was sent to the emergency room. ?There is no evidence for an acute coronary syndrome. ?The source of  the discomfort was thought to be abdominal and he was placed on an H2 blocker (famotidine) with some gradual improvement.?         Vitals:    12/11/19 1307 12/11/19 1318   BP: 124/72 122/76   BP Source: Arm, Left Upper Arm, Right Upper   Patient Position: Sitting Sitting   Pulse: 70    SpO2: 98%    Weight: 86.9 kg (191 lb 9.6 oz)    Height: 1.727 m (5' 8)    PainSc: Five      Body mass index is 29.13 kg/m?Marland Kitchen     Past Medical History  Patient Active Problem List    Diagnosis Date Noted   ? 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 Polyarthralgia 09/01/2014   ? TIA (transient ischemic attack) 08/04/2011   ? Palpitation 08/15/2008     Looping Event Monitor:  23 transmissions with no symptoms reported.  It was either a baseline recording or random recording.  The patient's monitor demonstrated sinus rhythm with sinus bradycardia and frequent PVCs of different morphology with, at times, couplets or bigeminal rhythm.  There also is separate ventricular beats noted at a rate of about 40-44 beats per minute.  These were followed by PVCs and it appears to be competing with the sinus rhythm, maybe due to increased refractory period after the PVC.  No symptoms reported during these beats.     ? CAD (coronary artery disease) 06/17/2008     A.  11/02/2005 adenosine thallium stress test abnormal with reversible apical anterior perfusion abnormality. Abnormal ECG.  EF 60%.  b.     11/05/2005 cardiac cath left main normal.  LAD tubular 60 percent proximal stenosis and first diagonal 80 percent ostial stenosis.  Left   circumflex 80 percent ostial stenosis followed by 30 percent mid stenosis.  RCA mid 70 percent stenosis. PLV moderate stenosis.  EF  55 percent.  Mild apical lateral hypokinesis.  c.     11/08/2005 coronary artery bypass with LIMA to LAD, a free RIMA as T-graft off LIMA to diagonal, and then sequential to obtuse marginal  branch of circumflex. Separate SVG to right PDA.  D. 06/06/08: Lexicon stress Spect- [facility]: mild intensity ischemia involving portions of the [place] anterolateral and lateral wall. EF 55%  E. 06/20/08: Cardiac cath at [facility]: LM- mild plaque, LAD- Prox-mid 80-99%, LCx: 80 % proximal/ostial circumflex, which spans the takeoff of the first  small [place] obtuse marginal branch. 60 % midportion of the circumflex. RCA: 100 % occluded distally, diffuse up to 50 % stenosis in the  proximal and mid segments of RCA.EF 55%. Patent LIMA to LAD, Patent SVG to PDA. EDP 14-16 mm Hg     ? 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       Separate SVG to right PDA.       ? Type II diabetes mellitus (HCC) 06/17/2008   ? HTN (hypertension) 06/17/2008   ? HLD (hyperlipidemia) 06/17/2008         Review of Systems   Constitutional: Negative.   HENT: Negative.    Eyes: Positive for blurred vision.   Cardiovascular: Positive for dyspnea on exertion.   Respiratory: Negative.    Endocrine: Positive for polyuria.  Hematologic/Lymphatic: Negative.    Skin: Negative.    Musculoskeletal: Positive for back pain, joint pain, muscle weakness, myalgias and neck pain.   Gastrointestinal: Negative.    Genitourinary: Positive for frequency.   Neurological: Positive for dizziness.   Psychiatric/Behavioral: Negative.    Allergic/Immunologic: Negative.        Physical Exam  GENERAL: The patient is well developed, well nourished, resting comfortably and in no distress.   HEENT: No abnormalities of the visible oro-nasopharynx, conjunctiva or sclera are noted.  NECK: There is no jugular venous distension. Carotids are palpable and without bruits. There is no thyroid enlargement.  Chest: Lung fields are clear to auscultation. There are no wheezes or crackles.  CV: There is a regular rhythm. The first and second heart sounds are normal. There are no murmurs, gallops or rubs.  ABD: The abdomen is soft and supple with normal bowel sounds. There is no hepatosplenomegaly, ascites, tenderness, masses or bruits.  Neuro: There are no focal motor defects. Ambulation is slow and stooped.  Cognitive function appears normal.  He has difficulty transferring due to arthritis in multiple joints.  Ext:?There is no edema or evidence of deep vein thrombosis. Peripheral pulses are satisfactory. ?  SKIN:?There are no rashes and no cellulitis  PSYCH:?The patient is calm, rationale and oriented.    Cardiovascular Studies  A twelve-lead ECG was obtained on 12/11/2019 reveals normal sinus rhythm with a heart rate of 63 bpm.  There is no evidence of myocardial ischemia or infarction.  Labs from 11/24/2019 revealed serum potassium 3.5 mmol/L and serum creatinine 0.78 mg/dL.  His hemoglobin was 13.2 g%.  Cardiovascular Health Factors  Vitals BP Readings from Last 3 Encounters:   12/11/19 122/76   06/12/19 120/72   12/19/18 138/80     Wt Readings from Last 3 Encounters:   12/11/19 86.9 kg (191 lb 9.6 oz)   06/12/19 90.4 kg (199 lb 6.4 oz)   12/19/18 90.4 kg (199 lb 3.2 oz)     BMI Readings from Last 3 Encounters:   12/11/19 29.13 kg/m?   06/12/19 30.32 kg/m?   12/19/18 30.29 kg/m?      Smoking Social History     Tobacco Use   Smoking Status Never Smoker   Smokeless Tobacco Former Neurosurgeon   ? Types: Chew      Lipid Profile Cholesterol   Date Value Ref Range Status   07/04/2018 105  Final     HDL   Date Value Ref Range Status   07/04/2018 38 (L) >=40 Final     LDL   Date Value Ref Range Status   07/04/2018 56  Final     Triglycerides   Date Value Ref Range Status   07/04/2018 54  Final      Blood Sugar Hemoglobin A1C   Date Value Ref Range Status   04/28/2015 7.6 (H) 4.5 - 6.5 Final     Glucose   Date Value Ref Range Status   11/24/2019 192  Final   07/04/2018 180 (H) 70 - 105 Final   07/18/2017 283 (H) 83 - 110 Final     Glucose, POC   Date Value Ref Range Status   11/14/2005 127 (H) 70 - 110 MG/DL Final   11/91/4782 956 (H) 70 - 110 MG/DL Final   21/30/8657 846 (H) 70 - 110 MG/DL Final          Problems Addressed Today  Encounter Diagnoses   Name Primary?   ? Primary hypertension Yes   ?  Coronary artery disease involving native heart without angina pectoris, unspecified vessel or lesion type    ? Essential hypertension    ? Arthritis        Assessment and Plan     Mr. Ayson appears stable from a cardiovascular perspective.?His blood pressure?appears much better controlled he has been trying to avoid arguments with people concerning Covid and politics.??I have asked the patient to keep a log book of his BP readings and to report BP readings exceeding 130/80?mm Hg. Regular mild aerobic exercise, weight loss and adherence to a heart healthy diabetic diet were recommended. I have asked the patient to carry sublingual nitroglycerin at all times. ?I have asked him to work on diabetic dietary compliance and diabetic control.?He indicates that he obtains his labs from his primary care physician.    Have placed a referral for him to be seen by [facility] for his diffuse arthritis.  I have asked Mr. Barrueta to return for follow-up in?12?months.         Current Medications (including today's revisions)  ? acetaminophen SR (TYLENOL) 650 mg tablet Take 650 mg by mouth every 6 hours as needed for Pain.   ? amLODIPine (NORVASC) 2.5 mg tablet TAKE 1 TABLET EVERY DAY   ? amoxicillin (AMOXIL) 500 mg capsule TAKE 4 CAPSULES BY MOUTH 1 HOUR PRIOR TO DENTAL PROCEDURE   ? atorvastatin (LIPITOR) 40 mg tablet TAKE 1 TABLET EVERY DAY   ? CALCIUM CARBONATE/VITAMIN D3 (CALCIUM + D PO) Take 1 tablet by mouth twice daily.   ? clopiDOGrel (PLAVIX) 75 mg tablet Take 75 mg by mouth daily.     ? glimepiride (AMARYL) 2 mg tablet Take 1 tablet by mouth twice daily.   ? levothyroxine (SYNTHROID) 25 mcg tablet Take 25 mcg by mouth daily.   ? metformin (GLUCOPHAGE) 1,000 mg tablet Take 1 Tab by mouth Twice Daily With Meals. DO NOT RESUME UNTIL THE EVENING OF MAY 20TH   ? metoprolol (LOPRESSOR) 25 mg tablet Take 0.5 Tabs by mouth Twice Daily.   ? nitroglycerin (NITROSTAT) 0.4 mg tablet Place 1 Tab under tongue every 5 minutes as needed for Chest Pain.   ? pantoprazole DR (PROTONIX) 40 mg tablet Take 40 mg by mouth daily.   ? traMADoL (ULTRAM) 50 mg tablet Take 1 tablet by mouth daily.

## 2020-01-25 IMAGING — CR CHEST
3 series · 3 of 3 positions shown · non-contrast
Comparison: none

[rib upper]
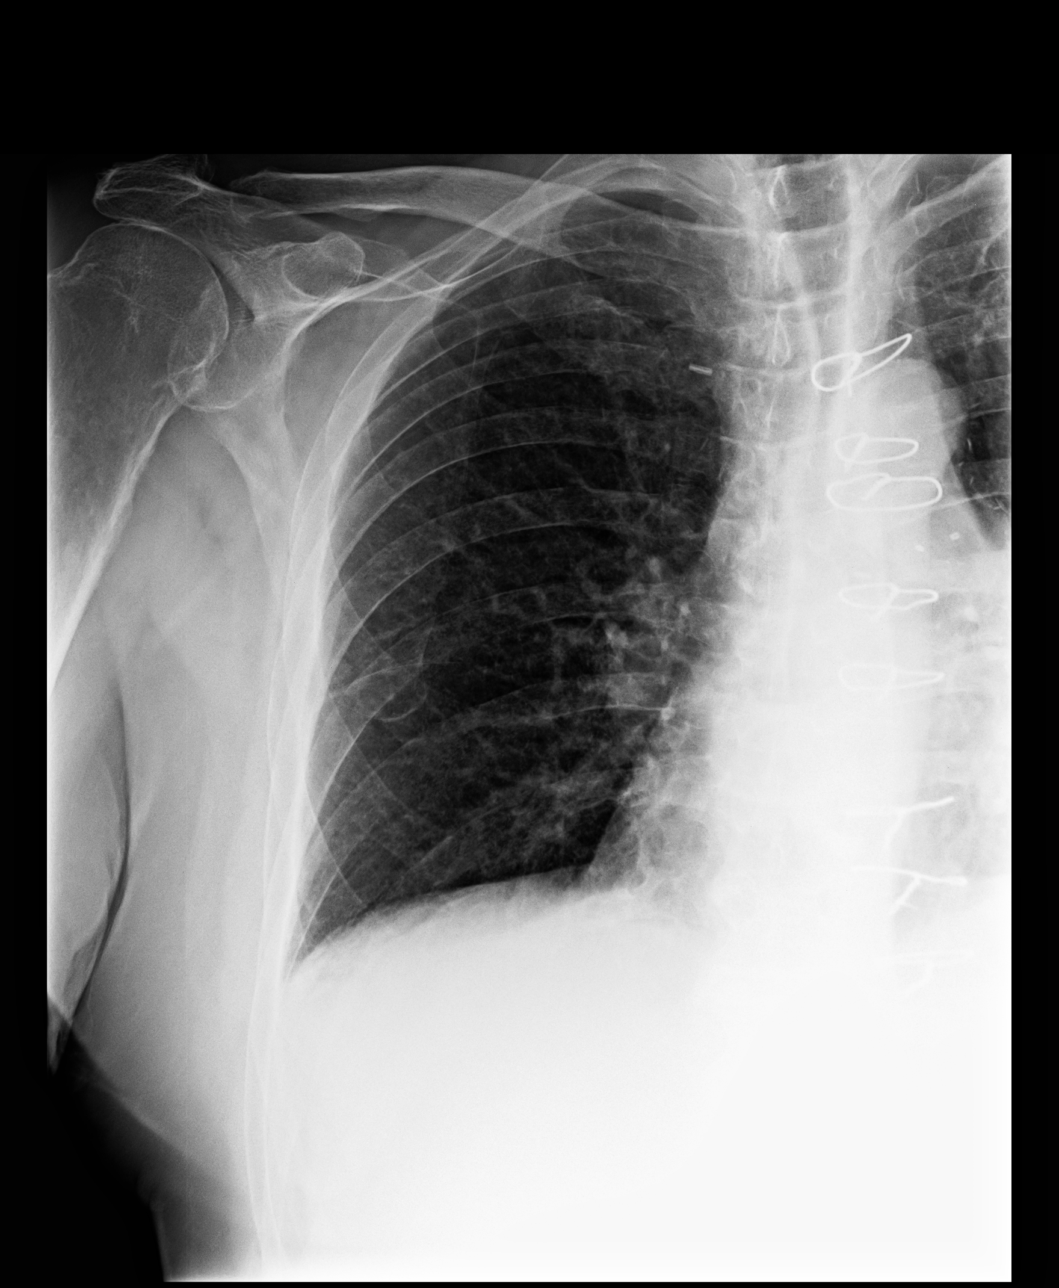

[rib upper obl]
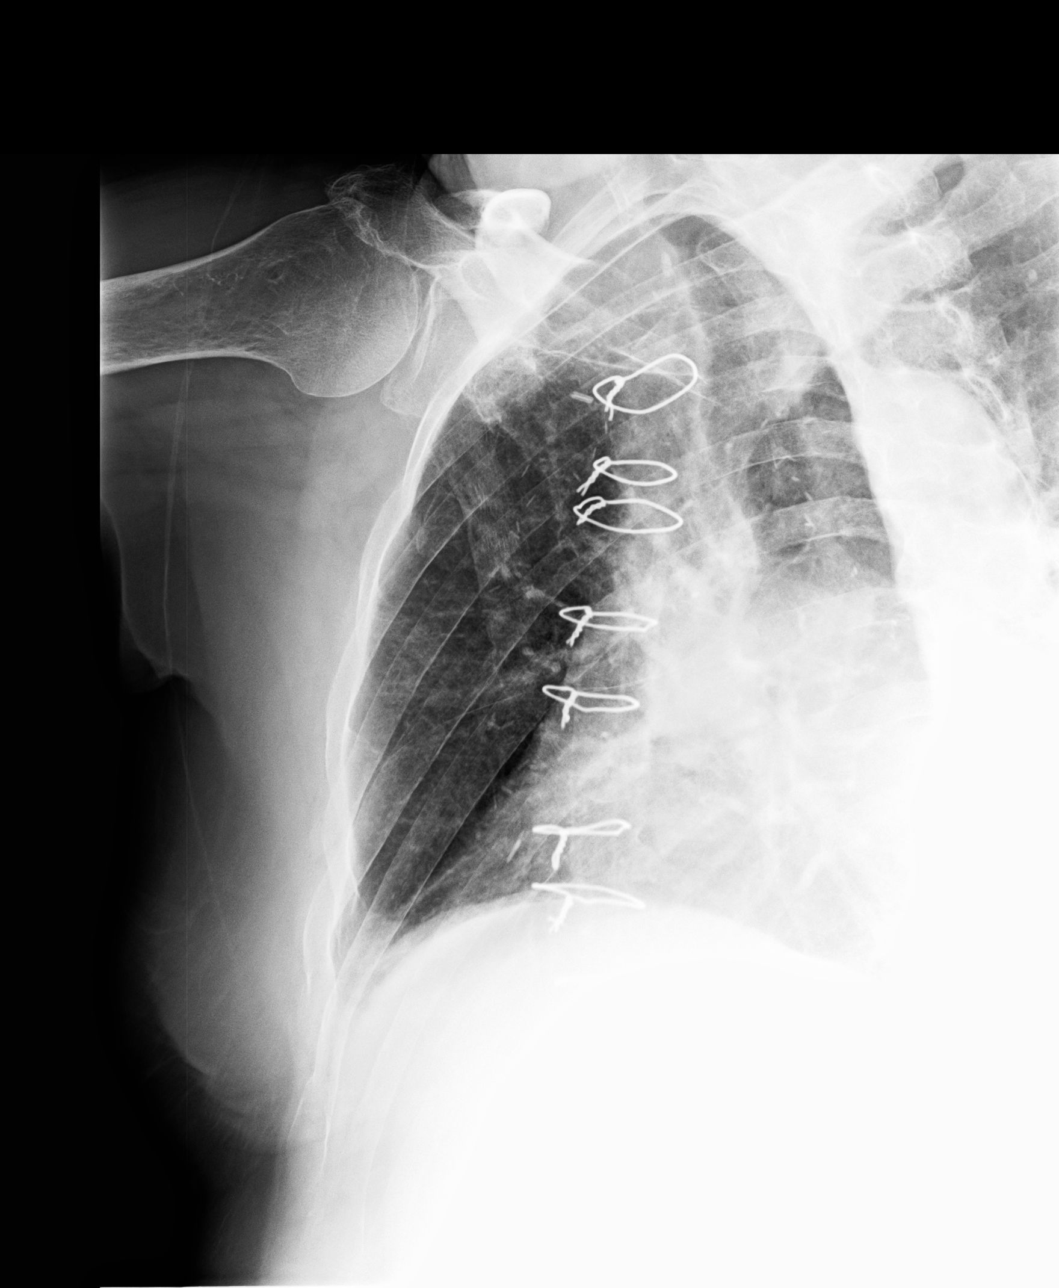

[rib lower]
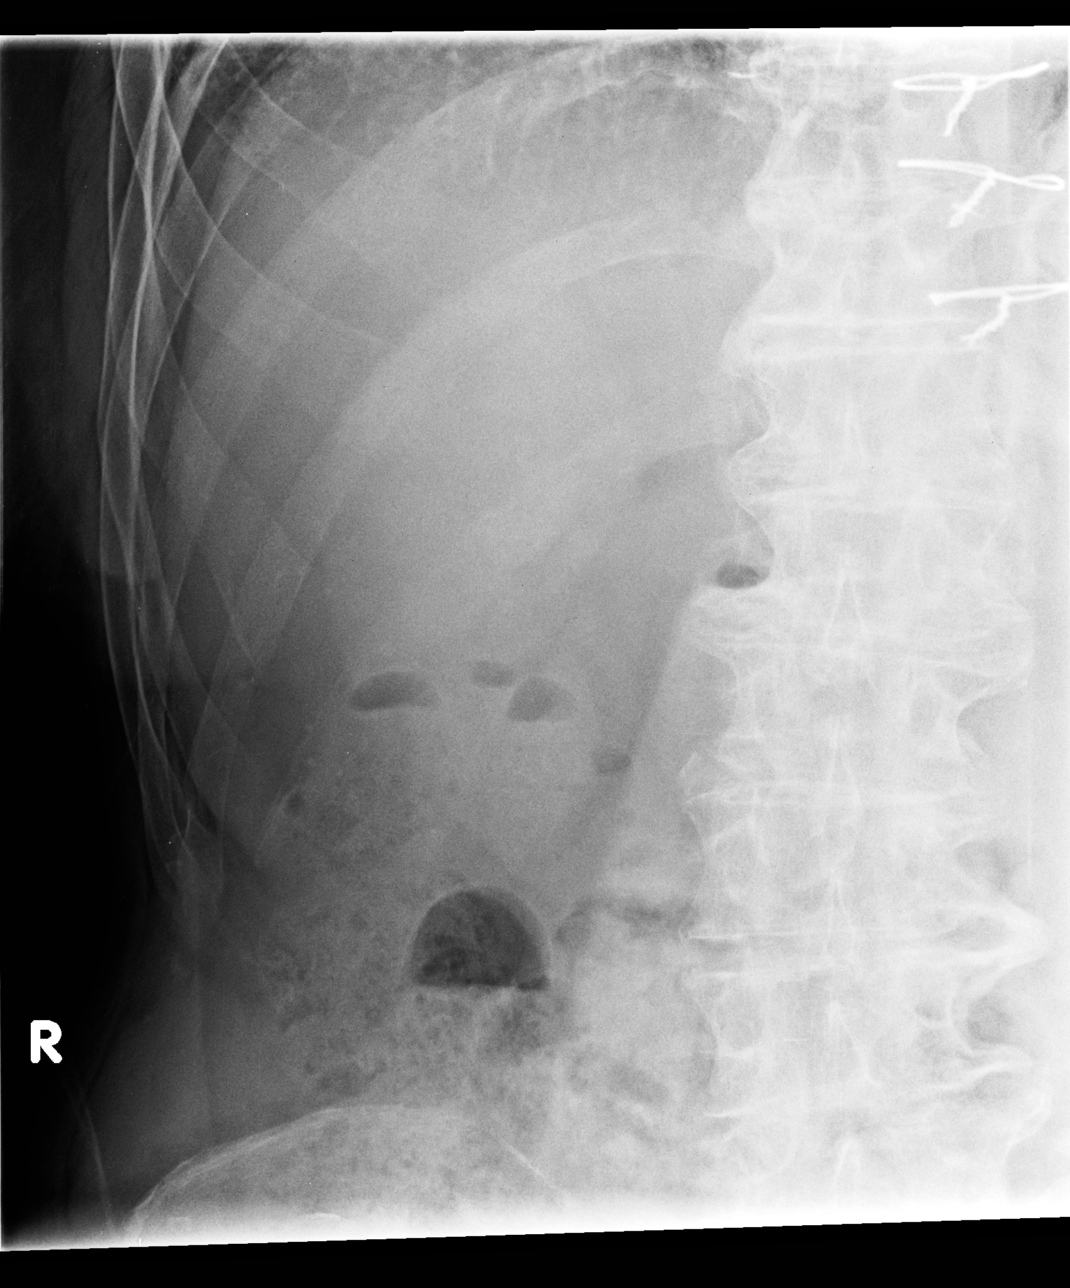

[3 of 3 positions shown; findings below may reference images not displayed]

DIAGNOSTIC STUDIES

EXAM

2 VIEWS right RIBS

INDICATION

Right side anterior 4-5 rib pain after low fall
Patient c/o Rt. rib pain after leaning over the arm of a chair to pick up a drink.   MB

TECHNIQUE

AP and oblique views right ribs

COMPARISONS

None available

FINDINGS

There is questionable subtle deformity of the lateral 8th and 9th ribs without evidence of large,
displaced fracture. The partially visualized lung is clear with chronic interstitial changes. There
is no pneumothorax.

IMPRESSION

Questionable mild rib deformities of the lateral 8th and 9th ribs without evidence of large
displaced fracture or pneumothorax.

Tech Notes:

Patient c/o Rt. rib pain after leaning over the arm of a chair to pick up a drink.
MB

## 2020-03-28 ENCOUNTER — Encounter: Admit: 2020-03-28 | Discharge: 2020-03-28 | Payer: MEDICARE

## 2020-06-23 ENCOUNTER — Encounter: Admit: 2020-06-23 | Discharge: 2020-06-23 | Payer: MEDICARE

## 2020-06-23 ENCOUNTER — Ambulatory Visit: Admit: 2020-06-23 | Discharge: 2020-06-24 | Payer: MEDICARE

## 2020-06-23 DIAGNOSIS — M899 Disorder of bone, unspecified: Secondary | ICD-10-CM

## 2020-06-23 DIAGNOSIS — M1990 Unspecified osteoarthritis, unspecified site: Secondary | ICD-10-CM

## 2020-06-23 DIAGNOSIS — M549 Dorsalgia, unspecified: Secondary | ICD-10-CM

## 2020-06-23 DIAGNOSIS — I10 Essential (primary) hypertension: Secondary | ICD-10-CM

## 2020-06-23 DIAGNOSIS — E119 Type 2 diabetes mellitus without complications: Secondary | ICD-10-CM

## 2020-06-23 DIAGNOSIS — M2550 Pain in unspecified joint: Secondary | ICD-10-CM

## 2020-06-23 DIAGNOSIS — E785 Hyperlipidemia, unspecified: Secondary | ICD-10-CM

## 2020-06-23 DIAGNOSIS — R5383 Other fatigue: Secondary | ICD-10-CM

## 2020-06-23 DIAGNOSIS — I2510 Atherosclerotic heart disease of native coronary artery without angina pectoris: Secondary | ICD-10-CM

## 2020-06-23 LAB — CCP IGG ANTIBODY

## 2020-06-23 LAB — C REACTIVE PROTEIN (CRP): C-REACTIVE PROTEIN: 0 mg/dL (ref <1.0)

## 2020-06-23 LAB — RHEUMATOID FACTOR (RF): RF SCREEN: <10 [IU]/mL (ref <25)

## 2020-06-23 LAB — TSH WITH FREE T4 REFLEX: TSH: 2.5 uU/mL (ref 0.35–5.00)

## 2020-06-23 LAB — SED RATE: ESR: 8 mm/h (ref 0–20)

## 2020-06-23 LAB — HEPATITIS C ANTIBODY W REFLEX HCV PCR QUANT

## 2020-06-23 LAB — VITAMIN B12: VITAMIN B12: 106 pg/mL — ABNORMAL LOW (ref 180–914)

## 2020-06-23 LAB — URIC ACID: URIC ACID: 4.6 mg/dL (ref 4.0–8.0)

## 2020-06-23 MED ORDER — DICLOFENAC SODIUM 1 % TP GEL
Freq: Four times a day (QID) | TOPICAL | 3 refills | 19.00000 days | Status: AC
Start: 2020-06-23 — End: ?

## 2020-06-24 DIAGNOSIS — M2550 Pain in unspecified joint: Principal | ICD-10-CM

## 2020-06-24 DIAGNOSIS — I10 Essential (primary) hypertension: Secondary | ICD-10-CM

## 2020-06-24 DIAGNOSIS — R5383 Other fatigue: Secondary | ICD-10-CM

## 2020-06-24 DIAGNOSIS — I2510 Atherosclerotic heart disease of native coronary artery without angina pectoris: Secondary | ICD-10-CM

## 2020-06-24 DIAGNOSIS — M1990 Unspecified osteoarthritis, unspecified site: Secondary | ICD-10-CM

## 2020-06-24 DIAGNOSIS — M549 Dorsalgia, unspecified: Secondary | ICD-10-CM

## 2020-07-03 ENCOUNTER — Encounter: Admit: 2020-07-03 | Discharge: 2020-07-03 | Payer: MEDICARE

## 2020-07-03 NOTE — Telephone Encounter
-----   Message from Christena Deem, MD sent at 07/01/2020  8:37 AM CDT -----  Harriett Sine, can you share a copy with his PCP and follow-up with him regarding B12 replacement?  Thanks    Lab work is reviewed: The inflammation levels are very low which is good.    Uric acid level, thyroid function, vitamin D is all normal or negative.    Protein electrophoresis and rheumatoid arthritis testing is negative/normal.    Vitamin B12 is very low.  This can contribute to fatigue and neuropathy type symptoms.    I recommend replacement with injection B12, 1000 mcg daily for 1 week, then weekly for 1 month, then monthly thereafter for at least a year or so.  This could be coordinated through our office or your primary care doctor and you can be taught to do them at home if desired.    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    The lumbar spine x-ray reveals kyphosis and anterior wedging with multiple degenerative/osteoarthritis changes through the back as well as some osteoarthritis of the hips.    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    We will schedule a return visit to see you back in the coming weeks.  I recommend getting started with the vitamin B12 now.  We can discuss your testing in more detail at follow-up

## 2020-07-03 NOTE — Telephone Encounter
Called and spoke to patient's Daughter [name]     She stated understanding.     Labs shared with Dr Trude Mcburney # provided so she and patient can have access to mychart.

## 2020-08-08 ENCOUNTER — Encounter: Admit: 2020-08-08 | Discharge: 2020-08-08 | Payer: MEDICARE

## 2020-08-08 MED ORDER — AMLODIPINE 2.5 MG PO TAB
ORAL_TABLET | Freq: Every day | 3 refills
Start: 2020-08-08 — End: ?

## 2020-08-20 ENCOUNTER — Encounter: Admit: 2020-08-20 | Discharge: 2020-08-20 | Payer: MEDICARE

## 2020-08-20 ENCOUNTER — Ambulatory Visit: Admit: 2020-08-20 | Discharge: 2020-08-21 | Payer: MEDICARE

## 2020-08-20 DIAGNOSIS — R5383 Other fatigue: Secondary | ICD-10-CM

## 2020-08-20 DIAGNOSIS — M899 Disorder of bone, unspecified: Secondary | ICD-10-CM

## 2020-08-20 DIAGNOSIS — M1990 Unspecified osteoarthritis, unspecified site: Secondary | ICD-10-CM

## 2020-08-20 DIAGNOSIS — M2550 Pain in unspecified joint: Secondary | ICD-10-CM

## 2020-08-20 DIAGNOSIS — M549 Dorsalgia, unspecified: Secondary | ICD-10-CM

## 2020-08-20 DIAGNOSIS — E785 Hyperlipidemia, unspecified: Secondary | ICD-10-CM

## 2020-08-20 DIAGNOSIS — I10 Essential (primary) hypertension: Secondary | ICD-10-CM

## 2020-08-20 DIAGNOSIS — I2510 Atherosclerotic heart disease of native coronary artery without angina pectoris: Secondary | ICD-10-CM

## 2020-08-20 DIAGNOSIS — E119 Type 2 diabetes mellitus without complications: Secondary | ICD-10-CM

## 2020-08-20 LAB — PARATHYROID HORMONE: PTH HORMONE: 34 pg/mL (ref 10–65)

## 2020-08-20 LAB — IRON + BINDING CAPACITY + %SAT+ FERRITIN
FERRITIN: 244 ng/mL (ref 30–300)
IRON BINDING: 328 ug/dL (ref 270–380)
IRON: 82 ug/dL (ref 50–185)

## 2020-08-20 LAB — MAGNESIUM: MAGNESIUM: 2 mg/dL — ABNORMAL LOW (ref 1.6–2.6)

## 2020-08-21 DIAGNOSIS — M2550 Pain in unspecified joint: Principal | ICD-10-CM

## 2020-08-21 DIAGNOSIS — M1990 Unspecified osteoarthritis, unspecified site: Secondary | ICD-10-CM

## 2021-02-24 ENCOUNTER — Encounter: Admit: 2021-02-24 | Discharge: 2021-02-24 | Payer: MEDICARE

## 2021-02-26 ENCOUNTER — Ambulatory Visit: Admit: 2021-02-26 | Discharge: 2021-02-27 | Payer: MEDICARE

## 2021-02-26 ENCOUNTER — Encounter: Admit: 2021-02-26 | Discharge: 2021-02-26 | Payer: MEDICARE

## 2021-02-26 DIAGNOSIS — E1169 Type 2 diabetes mellitus with other specified complication: Secondary | ICD-10-CM

## 2021-02-26 DIAGNOSIS — I10 Essential (primary) hypertension: Secondary | ICD-10-CM

## 2021-02-26 DIAGNOSIS — I2510 Atherosclerotic heart disease of native coronary artery without angina pectoris: Secondary | ICD-10-CM

## 2021-02-26 DIAGNOSIS — Z136 Encounter for screening for cardiovascular disorders: Secondary | ICD-10-CM

## 2021-02-26 DIAGNOSIS — E785 Hyperlipidemia, unspecified: Secondary | ICD-10-CM

## 2021-02-26 DIAGNOSIS — E119 Type 2 diabetes mellitus without complications: Secondary | ICD-10-CM

## 2021-02-26 NOTE — Progress Notes
Date of Service: 02/26/2021    Parker Ramos is a 81 y.o. male.       HPI     Parker Ramos is followed for coronary artery disease, hypertension, diabetes mellitus and hyperlipidemia.?Almost all of his current symptoms are related to arthritis. 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 ?He has seen pain therapy at [facility] and went through a course of warm water therapy without much benefit.??He has also seeing a rheumatologist at [facility] hospital.  He gets a little relief from his tramadol. ?His exercise tolerance continues to slowly worsen due to his arthritis.??It is apparent that he has difficulty transferring and ambulating. ?From a cardiovascular perspective, the patient reports that he has been stable and he reports no angina, current congestive symptoms, palpitations, sensation of sustained forceful heart pounding, lightheadedness or syncope. The patient reports no myalgias or bleeding abnormalities. His blood sugars vary with his diet.??He does not believe that he has diabetic neuropathy, retinopathy or nephropathy.?  ?  Historically, in the fall of 2007 Parker Ramos initially presented with symptoms of lightheadedness and a rapid heart beat. A stress test was performed and   was abnormal. He then underwent coronary angiography which revealed severe three-vessel coronary artery disease. He   underwent cardiac bypass surgery receiving a left internal mammary artery graft to the left anterior descending along   with a free right internal mammary artery T-graft off the LIMA to the diagonal and obtuse marginal branch of the   circumflex coronary artery. He also received a reverse saphenous vein graft to the posterior descending branch of the   right coronary artery. This was performed on November 08, 2005. The surgery was tolerated well and was uncomplicated. ?  In 2010 Parker Ramos underwent cardiac evaluation prior to left knee surgery. Coronary angiography was performed on 06/18/08 and revealed: ?  1. Severe multivessel native coronary artery disease.  2. Patent saphenous vein graft to PDA.  3. Patent LIMA to LAD with patent jump segments to the diagonal and obtuse marginal.  4. Normal left ventricular systolic function.   ?  Parker Ramos developed left facial numbness rather abruptly on 06/07/11 at approximately 10:45 AM. It lasted until approximately 3:30 PM and it was not associated with motor abnormalities, visual abnormalities, hearing abnormalities, cognitive abnormalities or difficulty with speech. The patient reports that a CT scan, carotid duplex and MRA were performed and that a transient ischemic attack was suspected. He reports that he was seen by a neurologist who placed him on Plavix instead of aspirin. 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 He reports that he developed dyspnea on 04/14/12 and was seen in the Emergency Room. He was noted to have pulmonary vascular congestion and reportedly was administered IV furosemide with improvement and his hydrochlorothiazide was restarted. 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 He reports that he developed hypotension after receiving Flomax and spent a night in the intensive care unit but reports no other complications such as chest discomfort, congestive heart failure or arrhythmias.?On?July 16, 2017?Parker Ramos?woke up with mid abdominal pain and nausea. ?He had occasional episodes of emesis. ?The patient?reports that he was evaluated on 07/18/2017 and was sent to the emergency room. ?There is no evidence for an acute coronary syndrome. ?The source of the discomfort was thought to be abdominal and he was placed on an  H2 blocker (famotidine) with some gradual improvement.?         Vitals:    02/26/21 1002   BP: (!) 148/74   BP Source: Arm, Left Upper   Pulse: 78   SpO2: 97%   O2 Device: None (Room air)   PainSc: Zero Weight: 90.7 kg (200 lb)   Height: 172.7 cm (5' 8)     Body mass index is 30.41 kg/m?Marland Kitchen     Past Medical History  Patient Active Problem List    Diagnosis Date Noted   ? 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 Polyarthralgia 09/01/2014   ? TIA (transient ischemic attack) 08/04/2011   ? Palpitation 08/15/2008     Looping Event Monitor:  23 transmissions with no symptoms reported.  It was either a baseline recording or random recording.  The patient's monitor demonstrated sinus rhythm with sinus bradycardia and frequent PVCs of different morphology with, at times, couplets or bigeminal rhythm.  There also is separate ventricular beats noted at a rate of about 40-44 beats per minute.  These were followed by PVCs and it appears to be competing with the sinus rhythm, maybe due to increased refractory period after the PVC.  No symptoms reported during these beats.     ? CAD (coronary artery disease) 06/17/2008     A.  11/02/2005 adenosine thallium stress test abnormal with reversible apical anterior perfusion abnormality. Abnormal ECG.  EF 60%.  b.     11/05/2005 cardiac cath left main normal.  LAD tubular 60 percent proximal stenosis and first diagonal 80 percent ostial stenosis.  Left   circumflex 80 percent ostial stenosis followed by 30 percent mid stenosis.  RCA mid 70 percent stenosis. PLV moderate stenosis.  EF  55 percent.  Mild apical lateral hypokinesis.  c.     11/08/2005 coronary artery bypass with LIMA to LAD, a free RIMA as T-graft off LIMA to diagonal, and then sequential to obtuse marginal  branch of circumflex. Separate SVG to right PDA.  D. 06/06/08: [facility] stress Spect- [facility]: mild intensity ischemia involving portions of the Ramos anterolateral and lateral wall. EF 55%  E. 06/20/08: Cardiac cath at [facility]: LM- mild plaque, LAD- Prox-mid 80-99%, LCx: 80 % proximal/ostial circumflex, which spans the takeoff of the first  small Kimber obtuse marginal branch. 60 % midportion of the circumflex. RCA: 100 % occluded distally, diffuse up to 50 % stenosis in the  proximal and mid segments of RCA.EF 55%. Patent LIMA to LAD, Patent SVG to PDA. EDP 14-16 mm Hg     ? 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       Separate SVG to right PDA.       ? Type II diabetes mellitus (HCC) 06/17/2008   ? HTN (hypertension) 06/17/2008   ? HLD (hyperlipidemia) 06/17/2008         Review of Systems   Constitutional: Negative.   HENT: Negative.    Eyes: Negative.    Cardiovascular: Negative.    Respiratory: Negative.    Endocrine: Negative.    Hematologic/Lymphatic: Negative.    Skin: Negative.    Musculoskeletal: Negative.    Gastrointestinal: Negative.    Genitourinary: Negative.    Neurological: Negative.    Psychiatric/Behavioral: Negative.  Allergic/Immunologic: Negative.        Physical Exam  GENERAL: The patient is well developed, well nourished, resting comfortably and in no distress.   HEENT: No abnormalities of the visible oro-nasopharynx, conjunctiva or sclera are noted.  NECK: There is no jugular venous distension. Carotids are palpable and without bruits. There is no thyroid enlargement.  Chest: Lung fields are clear to auscultation. There are no wheezes or crackles.  CV: There is a regular rhythm. The first and second heart sounds are normal. There are no murmurs, gallops or rubs.  ABD: The abdomen is soft and supple with normal bowel sounds. There is no hepatosplenomegaly, ascites, tenderness, masses or bruits.  Neuro: There are no focal motor defects. Ambulation is?slow and stooped. ?Cognitive function appears normal.??He has difficulty transferring due to arthritis in multiple joints.  Ext:?There is no edema or evidence of deep vein thrombosis. Peripheral pulses are satisfactory.  Difficulty with mobility from multiple arthritic joints is apparent.  SKIN:?There are no rashes and no cellulitis  PSYCH:?The patient is calm, rationale and oriented.    Cardiovascular Studies  A twelve-lead ECG was obtained on 02/26/2021 and reveals normal sinus rhythm with a heart rate of 71 bpm.  Multiple monomorphic premature ventricular ectopic beats are seen.  There is no evidence of myocardial ischemia or infarction.    Cardiovascular Health Factors  Vitals BP Readings from Last 3 Encounters:   02/26/21 (!) 148/74   08/20/20 (!) 149/61   06/23/20 (!) 144/85     Wt Readings from Last 3 Encounters:   02/26/21 90.7 kg (200 lb)   08/20/20 88.4 kg (194 lb 12.8 oz)   06/23/20 89 kg (196 lb 3.2 oz)     BMI Readings from Last 3 Encounters:   02/26/21 30.41 kg/m?   08/20/20 29.62 kg/m?   06/23/20 29.83 kg/m?      Smoking Social History     Tobacco Use   Smoking Status Never   Smokeless Tobacco Former   ? Types: Chew   ? Quit date: 02/01/1973      Lipid Profile Cholesterol   Date Value Ref Range Status   07/04/2018 105  Final     HDL   Date Value Ref Range Status   07/04/2018 38 (L) >=40 Final     LDL   Date Value Ref Range Status   07/04/2018 56  Final     Triglycerides   Date Value Ref Range Status   07/04/2018 54  Final      Blood Sugar Hemoglobin A1C   Date Value Ref Range Status   12/22/2020 7.3 (H) 4.5 - 6.5 Final     Glucose   Date Value Ref Range Status   09/10/2020 184 (H) 70 - 105 Final   11/24/2019 192  Final   07/04/2018 180 (H) 70 - 105 Final     Glucose, POC   Date Value Ref Range Status   11/14/2005 127 (H) 70 - 110 MG/DL Final   78/29/5621 308 (H) 70 - 110 MG/DL Final   65/78/4696 295 (H) 70 - 110 MG/DL Final          Problems Addressed Today  Encounter Diagnoses   Name Primary?   ? Screening for heart disease Yes   ? Coronary artery disease involving native heart without angina pectoris, unspecified vessel or lesion type    ? Essential hypertension    ? Primary hypertension    ? Hyperlipidemia, unspecified hyperlipidemia type    ? Type 2  diabetes mellitus with other specified complication, unspecified whether long term insulin use (HCC)        Assessment and Plan     Parker Ramos appears stable from a cardiovascular perspective.? His blood pressure was mildly elevated in the office today, although he has not been checking his blood pressure at home. ?I have asked the patient to keep a log book of his BP readings and to report BP readings exceeding 130/80?mm Hg. Regular mild aerobic exercise, weight loss and adherence to a heart healthy diabetic diet were recommended. I have asked the patient to carry sublingual nitroglycerin at all times. ?I have asked him to work on diabetic dietary compliance and diabetic control.?He indicates that he obtains his labs from his primary care physician.? However, he was given a requisition to check his fasting lipid profile. I have asked Parker Ramos to return for follow-up in?12?months. The total time spent during this interview and exam with preparation and chart review was 30 minutes.         Current Medications (including today's revisions)  ? acetaminophen SR (TYLENOL) 650 mg tablet Take 650 mg by mouth every 6 hours as needed for Pain.   ? amLODIPine (NORVASC) 2.5 mg tablet TAKE 1 TABLET EVERY DAY   ? amoxicillin (AMOXIL) 875 mg tablet Take  by mouth as Needed. Prior to dental appointments   ? atorvastatin (LIPITOR) 40 mg tablet TAKE 1 TABLET EVERY DAY (Patient taking differently: 20 mg.)   ? calcium carbonate (TUMS PO) Take  by mouth.   ? CALCIUM CARBONATE/VITAMIN D3 (CALCIUM + D PO) Take 1 tablet by mouth twice daily.   ? clopiDOGrel (PLAVIX) 75 mg tablet Take 75 mg by mouth daily.     ? glimepiride (AMARYL) 2 mg tablet Take 1 tablet by mouth twice daily.   ? INVOKANA 100 mg tablet Take 100 mg by mouth every morning.   ? JANUVIA 100 mg tablet Take 100 mg by mouth daily.   ? levothyroxine (SYNTHROID) 25 mcg tablet Take 25 mcg by mouth daily.   ? metformin (GLUCOPHAGE) 1,000 mg tablet Take 1 Tab by mouth Twice Daily With Meals. DO NOT RESUME UNTIL THE EVENING OF MAY 20TH   ? metoprolol (LOPRESSOR) 25 mg tablet Take 0.5 Tabs by mouth Twice Daily.   ? nitroglycerin (NITROSTAT) 0.4 mg tablet Place 1 Tab under tongue every 5 minutes as needed for Chest Pain.   ? propylene glycoL (SYSTANE BALANCE) 0.6 % drop   QID, OU, Maintenance, 06/09/20 15:12:00 CDT, 0 Number of Refills

## 2021-02-26 NOTE — Patient Instructions
Thank you for visiting our office today.    We would like to make the following medication adjustments:  NONE      Otherwise continue the same medications as you have been doing.          We will be pursuing the following tests after your appointment today:       Orders Placed This Encounter    LIPID PROFILE    ECG 12-LEAD     ** Call our nursing line with blood pressure and heart rate readings** [phone]    We will plan to see you back in 12 months.  Please call us in the meantime with any questions or concerns.        Please allow 5-7 business days for our providers to review your results. All normal results will go to MyChart. If you do not have Mychart, it is strongly recommended to get this so you can easily view all your results. If you do not have mychart, we will attempt to call you once with normal lab and testing results. If we cannot reach you by phone with normal results, we will send you a letter.  If you have not heard the results of your testing after one week please give [place] a call.       Your Cardiovascular Medicine [place] [facility] Team Brett Canales, Pilar Jarvis, Shawna Orleans, and [place])  phone number is [phone].

## 2021-03-03 ENCOUNTER — Encounter: Admit: 2021-03-03 | Discharge: 2021-03-03 | Payer: MEDICARE

## 2021-03-03 DIAGNOSIS — Z136 Encounter for screening for cardiovascular disorders: Secondary | ICD-10-CM

## 2021-03-03 DIAGNOSIS — I2510 Atherosclerotic heart disease of native coronary artery without angina pectoris: Secondary | ICD-10-CM

## 2021-03-03 DIAGNOSIS — E1169 Type 2 diabetes mellitus with other specified complication: Secondary | ICD-10-CM

## 2021-03-03 DIAGNOSIS — E785 Hyperlipidemia, unspecified: Secondary | ICD-10-CM

## 2021-03-03 DIAGNOSIS — I10 Essential (primary) hypertension: Secondary | ICD-10-CM

## 2021-03-03 LAB — LIPID PROFILE
CHOLESTEROL/HDL %: 3
CHOLESTEROL: 151
HDL: 45
LDL: 84
TRIGLYCERIDES: 114
VLDL: 23

## 2021-03-03 MED ORDER — ATORVASTATIN 40 MG PO TAB
40 mg | ORAL_TABLET | Freq: Every day | ORAL | 3 refills | Status: AC
Start: 2021-03-03 — End: ?

## 2021-03-03 NOTE — Telephone Encounter
-----   Message from Hester Mates, MD sent at 03/03/2021  4:55 PM CST -----  To all: I think he is taking atorvastatin 20 mg daily.  Increase him to 40 mg daily if he has not previously had myalgias at this dose.  If he has had myalgias at this does then add Zetia 10 mg daily.  Please recheck his fasting lipid profile and ALT in 3 months.  We will try to get his LDL cholesterol below 70 mg/dL.  Thanks.  SBG  ----- Message -----  From: Rogelia Boga, RN  Sent: 03/03/2021   1:31 PM CST  To: Hester Mates, MD    Labs for your review, LDL 84.  Pt is on atorvastatin 40mg  daily.  Please let me or the [facility] RNs know your recommendations.  Thank you!

## 2021-03-03 NOTE — Telephone Encounter
Called and spoke with patient's daughter, [name] about medication changes. She was agreeable to care plan and had no further questions at this time.

## 2021-05-18 ENCOUNTER — Encounter: Admit: 2021-05-18 | Discharge: 2021-05-18 | Payer: MEDICARE

## 2021-06-08 ENCOUNTER — Encounter: Admit: 2021-06-08 | Discharge: 2021-06-08 | Payer: MEDICARE

## 2021-06-09 ENCOUNTER — Encounter: Admit: 2021-06-09 | Discharge: 2021-06-09 | Payer: MEDICARE

## 2021-06-09 DIAGNOSIS — E785 Hyperlipidemia, unspecified: Secondary | ICD-10-CM

## 2021-06-09 DIAGNOSIS — I2510 Atherosclerotic heart disease of native coronary artery without angina pectoris: Secondary | ICD-10-CM

## 2021-06-09 DIAGNOSIS — I10 Essential (primary) hypertension: Secondary | ICD-10-CM

## 2021-06-09 DIAGNOSIS — E1169 Type 2 diabetes mellitus with other specified complication: Secondary | ICD-10-CM

## 2021-07-06 ENCOUNTER — Encounter: Admit: 2021-07-06 | Discharge: 2021-07-06 | Payer: MEDICARE

## 2021-07-08 ENCOUNTER — Encounter: Admit: 2021-07-08 | Discharge: 2021-07-08 | Payer: MEDICARE

## 2021-07-08 DIAGNOSIS — E785 Hyperlipidemia, unspecified: Secondary | ICD-10-CM

## 2021-07-08 DIAGNOSIS — I2510 Atherosclerotic heart disease of native coronary artery without angina pectoris: Secondary | ICD-10-CM

## 2021-07-08 MED ORDER — EZETIMIBE 10 MG PO TAB
10 mg | ORAL_TABLET | Freq: Every day | ORAL | 3 refills | Status: AC
Start: 2021-07-08 — End: ?

## 2021-07-08 NOTE — Telephone Encounter
-----   Message from Hester Mates, MD sent at 07/08/2021 12:19 PM CDT -----  To all: His LDL cholesterol is not bad but is above 70 mg/dL.  His snapshot indicates that he is taking atorvastatin 40 mg daily but my last note indicated he was only taking 20 mg daily.  If he is only taking 20 mg daily then please recommend that he increase the dose to 40 mg daily.  If he has had prior statin intolerance to higher doses then please add Zetia 10 mg daily.  Recheck his fasting lipid profile and ALT in 3 months.  Thanks.  SBG  ----- Message -----  From: Rogelia Boga, RN  Sent: 07/06/2021  12:23 PM CDT  To: Hester Mates, MD    Labs for your review, LDL 79, pt is on atorvastatin 40mg  daily. Please let me know if you have any recommendations. Thank you!

## 2021-07-08 NOTE — Telephone Encounter
Discussed with dtr pt taking 20mg  atorvastatin most likely due to muscle pain.  Zetia order placed.  Need lipids in 3mo.

## 2021-08-19 ENCOUNTER — Encounter: Admit: 2021-08-19 | Discharge: 2021-08-19 | Payer: MEDICARE

## 2021-08-19 MED ORDER — AMLODIPINE 2.5 MG PO TAB
ORAL_TABLET | 3 refills | Status: AC
Start: 2021-08-19 — End: ?

## 2021-10-08 ENCOUNTER — Encounter: Admit: 2021-10-08 | Discharge: 2021-10-08 | Payer: MEDICARE

## 2021-10-08 DIAGNOSIS — I2510 Atherosclerotic heart disease of native coronary artery without angina pectoris: Secondary | ICD-10-CM

## 2021-10-08 DIAGNOSIS — E785 Hyperlipidemia, unspecified: Secondary | ICD-10-CM

## 2021-10-08 DIAGNOSIS — I10 Essential (primary) hypertension: Secondary | ICD-10-CM

## 2021-12-15 ENCOUNTER — Encounter: Admit: 2021-12-15 | Discharge: 2021-12-15 | Payer: MEDICARE

## 2022-01-13 ENCOUNTER — Encounter: Admit: 2022-01-13 | Discharge: 2022-01-13 | Payer: MEDICARE

## 2022-01-13 DIAGNOSIS — E785 Hyperlipidemia, unspecified: Secondary | ICD-10-CM

## 2022-01-13 DIAGNOSIS — I2510 Atherosclerotic heart disease of native coronary artery without angina pectoris: Secondary | ICD-10-CM

## 2022-01-13 DIAGNOSIS — I10 Essential (primary) hypertension: Secondary | ICD-10-CM

## 2022-01-13 LAB — LIPID PROFILE
CHOLESTEROL/HDL %: 3
CHOLESTEROL: 111
HDL: 41
LDL: 52
VLDL: 18

## 2022-01-13 LAB — ALT (SGPT): ALT: 16

## 2022-02-02 ENCOUNTER — Encounter: Admit: 2022-02-02 | Discharge: 2022-02-02 | Payer: MEDICARE

## 2022-02-02 NOTE — Telephone Encounter
Received a call from the patient's PCP, Dr. Jaclyn Shaggy requesting for patient to have an appointment sooner than what is already scheduled with Dr. Jacklyn Shell. Dr. Jaclyn Shaggy reports he was in the office today and is in trigeminy.     Called and discussed with patient and patient's daughter. First available in [place]. [place] offered and scheduled even though with another provider besides Dr. Jacklyn Shell.

## 2022-02-03 ENCOUNTER — Encounter: Admit: 2022-02-03 | Discharge: 2022-02-03 | Payer: MEDICARE

## 2022-02-03 ENCOUNTER — Ambulatory Visit: Admit: 2022-02-03 | Discharge: 2022-02-04 | Payer: MEDICARE

## 2022-02-03 ENCOUNTER — Ambulatory Visit: Admit: 2022-02-03 | Discharge: 2022-02-03 | Payer: MEDICARE

## 2022-02-03 DIAGNOSIS — I10 Essential (primary) hypertension: Secondary | ICD-10-CM

## 2022-02-03 DIAGNOSIS — Z951 Presence of aortocoronary bypass graft: Secondary | ICD-10-CM

## 2022-02-03 DIAGNOSIS — G459 Transient cerebral ischemic attack, unspecified: Secondary | ICD-10-CM

## 2022-02-03 DIAGNOSIS — E119 Type 2 diabetes mellitus without complications: Secondary | ICD-10-CM

## 2022-02-03 DIAGNOSIS — I2510 Atherosclerotic heart disease of native coronary artery without angina pectoris: Secondary | ICD-10-CM

## 2022-02-03 DIAGNOSIS — R002 Palpitations: Secondary | ICD-10-CM

## 2022-02-03 DIAGNOSIS — E785 Hyperlipidemia, unspecified: Secondary | ICD-10-CM

## 2022-02-03 DIAGNOSIS — R0989 Other specified symptoms and signs involving the circulatory and respiratory systems: Secondary | ICD-10-CM

## 2022-02-03 MED ORDER — METOPROLOL TARTRATE 25 MG PO TAB
25 mg | ORAL_TABLET | Freq: Two times a day (BID) | ORAL | 0 refills | 90.00000 days | Status: AC
Start: 2022-02-03 — End: ?

## 2022-02-03 NOTE — Progress Notes
To our valued patient,     We have enrolled your heart monitor and requested it to be mailed to your home.  You should receive this within 2-3 business days. Please wear the monitor for 2 days. When you have completed the study, please remove the device, and mail it back to the company. Please call [employer] Customer Service at [phone] with questions about placement, troubleshooting, and insurance coverage. You can reach the ambulatory heart monitor team at [phone].        Please write your NAME, PHYSICIAN, START DATE/TIME on the diary.    Prep skin by shaving and ensuring there is no lotion on the chest.  Gently abrade for clear ecgs.  Showering is okay, but best to shower with back to the water.    Please use the diary for symptoms - specific date and times and what you are feeling.  Please return the device in the mailer with diary promptly after completing the study.        Your Heart Rhythm Management Team  Cardiovascular Medicine Department at [facility]          Ambulatory (External) Cardiac Monitor Enrollment Record     Placement Location: Home Enrollment  Clinic Location: [facility]  Vendor: [employer] ([employer])  Mobile Cardiac Telemetry (MCOT/MCT)?: No  Duration of Monitor (in days): 2  Monitor Diagnosis: Coronary Artery Disease (CAD) (i25.10)  Secondary Monitor Diagnosis: s/p CABG (Z95.1)  Ordering Provider: Shaune Leeks, DO  AMB Monitor Serial Number: home  No data recorded    Start Time and Date: 02/03/22 9:47 AM   Patient Name: Parker Ramos  DOB: 09-Jul-1940 02/18/42  MRN: [id number]  Sex: male  Mobile Phone Number: [phone] (mobile)  Home Phone Number: [phone]  Patient Address: [address] [place] [address]  Insurance Coverage: MEDICARE PART A AND B  Insurance ID: [id number]  Insurance Group #:   Insurance Subscriber: Espericueta,Esley L  Implanted Cardiac Device Information: No results found for: [id number]      Patient instructed to contact company phone number on the monitor box with questions regarding billing, placement, troubleshooting.     [name]    ____________________________________________________________    Clinic Staff:    Complete additional steps for documentation double check/Co-Sign.  In Follow-up, send chart upon closing encounter to P [facility] AMBULATORY MONITORS    [facility] Ambulatory Monitoring Team:  Schedule on appropriate template and check-in.   Clinic Placement Schedule on clinic location [facility] schedule   Home Enrollment Schedule on Home Enrollment schedule ([facility])   Given to patient in clinic for self-placement Schedule on Home Enrollment schedule ([facility]THM)   Inpatient Schedule on [place] [facility] AMBULATORY MONITORING template   2. Please enroll with appropriate vendor.

## 2022-02-03 NOTE — Progress Notes
Cardiology Consultation Note     Date of Service: 02/03/2022    History of Present Illness     Parker Ramos is a 82 y.o. male whom returns to [facility] at our [facility].  He is seen today as an urgent work in at the request of his PCP for frequent PVCs.  He normally follows with Dr. Dedra Skeens.  He is accompanied today by his daughter Parker Ramos.         [name] is an extremely pleasant 82 year old male with a history of coronary artery disease and coronary artery bypass grafting, hypertension, hyperlipidemia, diabetes, and obesity.  In 2007 he had symptoms of lightheadedness and a rapid heartbeat.  A stress test was performed which was abnormal and he subsequently underwent coronary angiography which revealed multivessel coronary artery disease.  He underwent cardiac bypass surgery receiving a LIMA to the LAD along with a free right internal mammary artery to the graft off the LIMA to the diagonal and obtuse marginal branch of the circumflex artery.  Additionally he received a reverse saphenous vein graft to the PDA.  In 2010 he underwent cardiac evaluation prior to left knee surgery with coronary angiography revealing severe multivessel coronary disease patent SVG graft to PDA, patent LIMA to LAD with patent jump segment to diagonal and obtuse marginal branch.  He had a pharmacologic MPI in 2014 which was negative for inducible ischemia.  His last echocardiogram was in November 2014 and revealed LVEF of 55%, mild diastolic dysfunction, no significant valvular abnormalities, and estimated PASP of 42 mmHg.    He saw Dr. Threasa Beards, endocrinologist, and Dr. Andreas Newport, PCP, yesterday.  His A1c was 8.1 down from 8.7. Glimeperide was increased from once a day to twice a day.  His PCP noted trigeminal PVCs on cardiac auscultation.  No ECG was obtained.     He presents today for further evaluation.  Overall, he states that he is doing well.  He does not notice frequent palpitations, heart pounding, lightheadedness or dizziness, presyncope or syncope.  He is not overtly active primarily due to multiple joint issues as well as diffuse arthritis.  His exercise capacity is limited due to his musculoskeletal complaints.  He does have steps in his home that he ascends.  He does not notice any overt anginal chest pain or pressure.  He reports some exertional dyspnea.  He notes chronic intermittent lower extremity edema which is worse at the end of the day and improves in the a.m.  He has not routinely been taking his blood pressures.  He notes that his blood pressure in the doctor's offices yesterday ranged 120-130 mmHg.        Review of Systems   Constitutional: Positive for malaise/fatigue.   HENT: Negative.     Eyes: Negative.    Cardiovascular:  Positive for dyspnea on exertion and leg swelling.   Respiratory: Negative.     Endocrine: Negative.    Hematologic/Lymphatic: Bruises/bleeds easily.   Skin: Negative.    Musculoskeletal:  Positive for back pain, muscle cramps, muscle weakness, myalgias and neck pain.   Gastrointestinal: Negative.    Genitourinary: Negative.    Neurological:  Positive for dizziness, headaches, light-headedness and weakness.   Psychiatric/Behavioral: Negative.     Allergic/Immunologic: Negative.        Current Medications (including today's revisions)   acetaminophen SR (TYLENOL) 650 mg tablet Take one tablet by mouth every 6 hours as needed for Pain.    amLODIPine (NORVASC) 2.5 mg  tablet TAKE 1 TABLET EVERY DAY    amoxicillin (AMOXIL) 500 mg capsule Take one capsule by mouth as Needed. Take before dental procedures    atorvastatin (LIPITOR) 40 mg tablet Take one tablet by mouth daily.    calcium carbonate (TUMS PO) Take  by mouth.    CHOLEcalciferoL (vitamin D3) 1,000 units tablet Take one tablet by mouth daily.    clopiDOGrel (PLAVIX) 75 mg tablet Take one tablet by mouth daily.      ezetimibe (ZETIA) 10 mg tablet Take one tablet by mouth daily.    glimepiride (AMARYL) 2 mg tablet Take one tablet by mouth twice daily.    INVOKANA 300 mg tablet Take one tablet by mouth daily with breakfast.    JANUVIA 100 mg tablet Take one tablet by mouth daily.    levothyroxine (SYNTHROID) 25 mcg tablet Take one tablet by mouth daily.    metformin (GLUCOPHAGE) 1,000 mg tablet Take 1 Tab by mouth Twice Daily With Meals. DO NOT RESUME UNTIL THE EVENING OF MAY 20TH    metoprolol (LOPRESSOR) 25 mg tablet Take 0.5 Tabs by mouth Twice Daily.    nitroglycerin (NITROSTAT) 0.4 mg tablet Place 1 Tab under tongue every 5 minutes as needed for Chest Pain.    propylene glycoL (SYSTANE BALANCE) 0.6 % drop   QID, OU, Maintenance, 06/09/20 15:12:00 CDT, 0 Number of Refills       Allergies  Allergies   Allergen Reactions    Bee Sting [Allergen Ext-Venom-Honey Bee] ANAPHYLAXIS    Nickel SEE COMMENTS     Adverse reaction        Patient Histories  Past Medical History:   Diagnosis Date    CAD (coronary artery disease) 06/17/2008    HLD (hyperlipidemia) 06/17/2008    HTN (hypertension) 06/17/2008    Personal history of CABG (coronary artery bypass graft) 06/17/2008    Type II diabetes mellitus (HCC) 06/17/2008      Surgical History:   Procedure Laterality Date    HERNIA REPAIR  2002    ROTATOR CUFF REPAIR  2004    right    CARDIAC SURGERY  2007    bypass    HX JOINT REPLACEMENT Right     knee    KNEE SURGERY      left and right knee were scoped      Social History     Tobacco Use    Smoking status: Never    Smokeless tobacco: Former     Types: Chew     Quit date: 02/01/1973   Substance Use Topics    Alcohol use: No    Drug use: No      Objective     Vital Signs This Visit  Vitals:    02/03/22 0842   BP: 138/60   BP Source: Arm, Left Upper   Pulse: 44   SpO2: 97%   O2 Device: None (Room air)   PainSc: Zero   Weight: 87.3 kg (192 lb 6.4 oz)   Height: 172.7 cm (5' 8)       Physical Exam  General Appearance: No acute distress. Fully alert and oriented.  Skin: Warm. No ulcers or xanthomas.   HEENT: Grossly unremarkable. Lips and oral mucosa without pallor or cyanosis. Moist mucous membranes.   Neck Veins: Normal jugular venous pressure. Neck veins are not distended.  Carotid Arteries: Normal carotid upstroke bilaterally. No bruits.  Chest Inspection: Midline scar consistent with prior sternotomy.    Auscultation/Percussion: Normal respiratory effort. Lungs  clear to auscultation bilaterally. No wheezes, rales, or rhonchi.    Cardiac Rhythm: Regular rhythm, frequent extrasystolic beats. Normal rate.  Cardiac Auscultation: Normal S1 & S2. No S3 or S4. No rub.  Murmurs: No cardiac murmurs.  Peripheral Circulation: Normal peripheral circulation.   Extremities: Appropriately warm to touch. No lower extremity edema.  Neurologic Exam: Neurological assessment grossly intact.    Recent ECG: Sinus rhythm, trigeminal PVCs, poor R wave progression.  Normal axis, normal intervals, normal segments.    Cardiovascular Health Factors  Vitals BP Readings from Last 3 Encounters:   02/03/22 138/60   02/26/21 (!) 148/74   08/20/20 (!) 149/61     Wt Readings from Last 3 Encounters:   02/03/22 87.3 kg (192 lb 6.4 oz)   02/26/21 90.7 kg (200 lb)   08/20/20 88.4 kg (194 lb 12.8 oz)     BMI Readings from Last 3 Encounters:   02/03/22 29.25 kg/m?   02/26/21 30.41 kg/m?   08/20/20 29.62 kg/m?      Smoking Social History     Tobacco Use   Smoking Status Never   Smokeless Tobacco Former    Types: Chew    Quit date: 02/01/1973      Lipid Profile Cholesterol   Date Value Ref Range Status   01/13/2022 111  Final     HDL   Date Value Ref Range Status   01/13/2022 41  Final     LDL   Date Value Ref Range Status   01/13/2022 52  Final     Triglycerides   Date Value Ref Range Status   01/13/2022 90  Final      Blood Sugar Hemoglobin A1C   Date Value Ref Range Status   07/06/2021 8.7  Final     Glucose   Date Value Ref Range Status   01/13/2022 166 (H) 70 - 105 Final   07/06/2021 205 (H) 70 - 105 Final   05/13/2021 211 (H) 70 - 105 Final     Glucose, POC   Date Value Ref Range Status 11/14/2005 127 (H) 70 - 110 MG/DL Final   45/40/9811 914 (H) 70 - 110 MG/DL Final   78/29/5621 308 (H) 70 - 110 MG/DL Final      10 Year ASCVD Risk The ASCVD Risk score (Arnett DK, et al., 2019) failed to calculate for the following reasons:    The patient has a prior MI or stroke diagnosis          Assessment and Plan        Rollan Roger Chow is a 82 y.o. who was seen for the following problems:    Problems Addressed Today  Encounter Diagnoses   Name Primary?    Coronary artery disease involving native heart without angina pectoris, unspecified vessel or lesion type Yes    Hx of CABG     Type 2 diabetes mellitus without complication, without long-term current use of insulin (HCC)     Primary hypertension     Hyperlipidemia, unspecified hyperlipidemia type     TIA (transient ischemic attack)     Palpitation     Cardiovascular symptoms        Frequent PVCs, asymptomatic: ECG today demonstrates monomorphic PVCs in a trigeminal fashion.  Recent labs revealed a K of 4.7.  Will have patient wear a 48-hour Holter monitor to assess his PVC burden.  Given history of coronary artery disease and no recent ischemic evaluation, will obtain a pharmacologic MPI to exclude  ischemia mediated PVCs.  Will increase Lopressor to 25 mg twice daily.  Further testing and management dependent upon above workup.    Coronary artery disease status post coronary artery past grafting 2007: LIMA-LAD, free RIMA from LIMA to diagonal and obtuse marginal, SVG to RPDA.  LHC in 2010 with patent grafts.  Pharmacologic MPI in 2014 without inducible ischemia.  Patient is on Plavix monotherapy.    Hypertension: Blood pressures fairly controlled.  Patient not routinely checking his blood pressure at home.  He states that office-based blood pressures yesterday were controlled.  Discussed ongoing home-based blood pressure monitoring.    Diabetes mellitus, type II, not on insulin therapy, A1c 8.1: Follows with outside endocrinologist.  On SGLT2 inhibitor and DDP 4.  Has tried Trulicity in the past but insurance would not cover.  Currently not on a GLP-1 agonist.      Hyperlipidemia, LDL 52, at goal: Continue atorvastatin and Zetia    History of TIA: On Plavix monotherapy    Thank you for allowing [place] to partake in the care of your patient. We will follow up with the ordered studies.  He has scheduled follow-up with primary cardiologist Dr. Justice Britain in March.  Please call us in the meantime with any new questions or concerns.    Modena Jansky, DO  Staff Cardiologist  Contact via Voalte or Pager #: [phone]    Total time spent on today's office visit was .  This includes over 50% face-to-face in person visit with patient as well as nonface-to-face time including review of the EMR, outside records, labs, radiologic studies, echocardiogram & other cardiovascular studies, formation of treatment plan, after visit summary, future disposition, and lastly on documentation.    This note was in part completed with Dragon, a Chemical engineer. Some grammatical errors may have occurred. If you have concerns,please contact my office for clarification.

## 2022-02-03 NOTE — Patient Instructions
Thank you for visiting our office today.    We would like to make the following medication adjustments:    Increase Lopressor to 25mg  twice daily       Otherwise continue the same medications as you have been doing.          We will be pursuing the following tests after your appointment today:       Orders Placed This Encounter    ECG 12-LEAD    metoprolol tartrate 25 mg tablet    HOLTER WEARABLE ECG MONITOR CONNECT + SCAN    REGADENOSON MPI STRESS TEST         We will plan to see you back in March.  Please call us in the meantime with any questions or concerns.        Please allow 5-7 business days for our providers to review your results. All normal results will go to MyChart. If you do not have Mychart, it is strongly recommended to get this so you can easily view all your results. If you do not have mychart, we will attempt to call you once with normal lab and testing results. If we cannot reach you by phone with normal results, we will send you a letter.  If you have not heard the results of your testing after one week please give [place] a call.       Your Cardiovascular Medicine [place] Team Richardson Landry, Rene Kocher, Threasa Beards, and [place])  phone number is [phone].

## 2022-02-24 ENCOUNTER — Encounter: Admit: 2022-02-24 | Discharge: 2022-02-24 | Payer: MEDICARE

## 2022-03-02 ENCOUNTER — Encounter: Admit: 2022-03-02 | Discharge: 2022-03-02 | Payer: MEDICARE

## 2022-03-02 ENCOUNTER — Ambulatory Visit: Admit: 2022-03-02 | Discharge: 2022-03-02 | Payer: MEDICARE

## 2022-03-02 DIAGNOSIS — I25119 Atherosclerotic heart disease of native coronary artery with unspecified angina pectoris: Secondary | ICD-10-CM

## 2022-03-11 ENCOUNTER — Encounter: Admit: 2022-03-11 | Discharge: 2022-03-11 | Payer: MEDICARE

## 2022-03-11 DIAGNOSIS — R0609 Other forms of dyspnea: Secondary | ICD-10-CM

## 2022-03-11 DIAGNOSIS — R9439 Abnormal result of other cardiovascular function study: Secondary | ICD-10-CM

## 2022-03-11 DIAGNOSIS — R002 Palpitations: Secondary | ICD-10-CM

## 2022-03-11 DIAGNOSIS — I10 Essential (primary) hypertension: Secondary | ICD-10-CM

## 2022-03-11 DIAGNOSIS — I2510 Atherosclerotic heart disease of native coronary artery without angina pectoris: Secondary | ICD-10-CM

## 2022-03-11 NOTE — Telephone Encounter
-----   Message from Jefferey Pica, DO sent at 03/11/2022  8:35 AM CST -----  Regarding: RE: results  I sent you a follow-up result message.  We can plan on having him do a left heart catheterization and echocardiogram for further evaluation.  Let me know if they have any questions or concerns.  Thanks.    TMB  ----- Message -----  From: Asencion Noble  Sent: 03/08/2022   3:00 PM CST  To: Jefferey Pica, DO  Subject: results                                          Stress and holter are back.  SBG is out for sometime.  Patient's dtr is calling wanting to review result.  What would you like me to tell her.      Thanks

## 2022-03-15 ENCOUNTER — Encounter: Admit: 2022-03-15 | Discharge: 2022-03-15 | Payer: MEDICARE

## 2022-03-15 DIAGNOSIS — E785 Hyperlipidemia, unspecified: Secondary | ICD-10-CM

## 2022-03-15 DIAGNOSIS — E119 Type 2 diabetes mellitus without complications: Secondary | ICD-10-CM

## 2022-03-15 DIAGNOSIS — R002 Palpitations: Secondary | ICD-10-CM

## 2022-03-15 DIAGNOSIS — I493 Ventricular premature depolarization: Secondary | ICD-10-CM

## 2022-03-15 DIAGNOSIS — I2510 Atherosclerotic heart disease of native coronary artery without angina pectoris: Secondary | ICD-10-CM

## 2022-03-15 DIAGNOSIS — I10 Essential (primary) hypertension: Secondary | ICD-10-CM

## 2022-03-15 DIAGNOSIS — Z951 Presence of aortocoronary bypass graft: Secondary | ICD-10-CM

## 2022-03-15 DIAGNOSIS — I200 Unstable angina: Secondary | ICD-10-CM

## 2022-03-15 DIAGNOSIS — G459 Transient cerebral ischemic attack, unspecified: Secondary | ICD-10-CM

## 2022-03-15 DIAGNOSIS — Z136 Encounter for screening for cardiovascular disorders: Secondary | ICD-10-CM

## 2022-03-15 MED ORDER — ASPIRIN 325 MG PO TAB
325 mg | Freq: Once | ORAL | 0 refills
Start: 2022-03-15 — End: ?

## 2022-03-15 NOTE — Progress Notes
Cardiology Consultation Note     Date of Service: 03/15/2022    History of Present Illness     Parker Ramos is a 82 y.o. male whom returns to [facility] clinic at our [facility] clinic.  He is seen today as an H&P prior to left heart catheterization which is been requested due to an abnormal MPI and frequent and symptomatic PVCs.  He normally follows with Dr. Dedra Skeens.  He is accompanied today by his daughter Parker Ramos.         Parker Ramos is an extremely pleasant 82 year old male with a history of coronary artery disease and coronary artery bypass grafting, hypertension, hyperlipidemia, diabetes, and obesity.  In 2007 he had symptoms of lightheadedness and a rapid heartbeat.  A stress test was performed which was abnormal and he subsequently underwent coronary angiography which revealed multivessel coronary artery disease.  He underwent cardiac bypass surgery receiving a LIMA to the LAD along with a free right internal mammary artery to the graft off the LIMA to the diagonal and obtuse marginal branch of the circumflex artery.  Additionally he received a reverse saphenous vein graft to the PDA.  In 2010 he underwent cardiac evaluation prior to left knee surgery with coronary angiography revealing severe multivessel coronary disease patent SVG graft to PDA, patent LIMA to LAD with patent jump segment to diagonal and obtuse marginal branch.  He had a pharmacologic MPI in 2014 which was negative for inducible ischemia.  His last echocardiogram was in November 2014 and revealed LVEF of 55%, mild diastolic dysfunction, no significant valvular abnormalities, and estimated PASP of 42 mmHg.    He subsequently underwent MPI stress test on 03/02/2022 which was considered abnormal with a predominantly fixed defect in the inferolateral and anterolateral and anteroapical walls with associated hypokinesis with a summed stress score of 14 with a summed rest score of 13.  Visually LVEF was noted to be 45% and this was considered an intermediate risk study.  Moreover he underwent a 3-day ambulatory Holter monitor which demonstrated a total PVC burden of 25.5% burden which were mildly symptomatic.    He is presenting today prior to plans to undergo an echocardiogram to assess cardiac structure and function and left heart catheterization to further define coronary anatomy.  He continues to notice palpitations and a heart pounding sensation, but he has not had any significant chest pain, syncope, dizziness, lightheadedness, PND, with apnea or lower extremity edema which is sustained.  He occasionally notes dependent edema which improves by the morning.  He has been compliant with medical therapy.       Review of Systems   Constitutional: Positive for malaise/fatigue.   HENT: Negative.     Eyes:  Positive for blurred vision.   Cardiovascular:  Positive for dyspnea on exertion, irregular heartbeat, leg swelling and palpitations.   Respiratory: Negative.     Endocrine: Negative.    Hematologic/Lymphatic: Negative.    Skin: Negative.    Musculoskeletal:  Positive for arthritis, back pain, joint pain, muscle cramps, muscle weakness, myalgias and neck pain.   Gastrointestinal: Negative.    Genitourinary: Negative.    Neurological:  Positive for dizziness, headaches, light-headedness and weakness.   Psychiatric/Behavioral: Negative.     Allergic/Immunologic: Negative.        Current Medications (including today's revisions)   acetaminophen SR (TYLENOL) 650 mg tablet Take one tablet by mouth every 6 hours as needed for Pain.    amLODIPine (NORVASC) 2.5 mg tablet TAKE 1 TABLET  EVERY DAY    amoxicillin (AMOXIL) 500 mg capsule Take one capsule by mouth as Needed. Take before dental procedures    atorvastatin (LIPITOR) 40 mg tablet Take one tablet by mouth daily.    calcium carbonate (TUMS PO) Take  by mouth.    CHOLEcalciferoL (vitamin D3) 1,000 units tablet Take one tablet by mouth daily.    clopiDOGrel (PLAVIX) 75 mg tablet Take one tablet by mouth daily.      ezetimibe (ZETIA) 10 mg tablet Take one tablet by mouth daily.    glimepiride (AMARYL) 2 mg tablet Take one tablet by mouth twice daily.    INVOKANA 300 mg tablet Take one tablet by mouth daily with breakfast.    JANUVIA 100 mg tablet Take one tablet by mouth daily.    levothyroxine (SYNTHROID) 25 mcg tablet Take one tablet by mouth daily.    metformin (GLUCOPHAGE) 1,000 mg tablet Take 1 Tab by mouth Twice Daily With Meals. DO NOT RESUME UNTIL THE EVENING OF MAY 20TH    metoprolol tartrate 25 mg tablet Take one tablet by mouth twice daily.    nitroglycerin (NITROSTAT) 0.4 mg tablet Place 1 Tab under tongue every 5 minutes as needed for Chest Pain.    propylene glycoL (SYSTANE BALANCE) 0.6 % drop   QID, OU, Maintenance, 06/09/20 15:12:00 CDT, 0 Number of Refills    traMADoL (ULTRAM) 50 mg tablet Take one tablet by mouth as Needed.       Allergies  Allergies   Allergen Reactions    Bee Sting [Allergen Ext-Venom-Honey Bee] ANAPHYLAXIS    Nickel SEE COMMENTS     Adverse reaction        Patient Histories  Past Medical History:   Diagnosis Date    CAD (coronary artery disease) 06/17/2008    HLD (hyperlipidemia) 06/17/2008    HTN (hypertension) 06/17/2008    Personal history of CABG (coronary artery bypass graft) 06/17/2008    Type II diabetes mellitus (HCC) 06/17/2008      Surgical History:   Procedure Laterality Date    HERNIA REPAIR  2002    ROTATOR CUFF REPAIR  2004    right    CARDIAC SURGERY  2007    bypass    HX JOINT REPLACEMENT Right     knee    KNEE SURGERY      left and right knee were scoped      Social History     Tobacco Use    Smoking status: Never    Smokeless tobacco: Former     Types: Chew     Quit date: 02/01/1973   Substance Use Topics    Alcohol use: No    Drug use: No      Objective     Vital Signs This Visit  Vitals:    03/15/22 1328   BP: 122/56   BP Source: Arm, Left Upper   Pulse: 57   SpO2: 97%   O2 Device: None (Room air)   PainSc: Eight   Weight: 88.7 kg (195 lb 9.6 oz)   Height: 172.7 cm (5' 8)       Physical Exam  General Appearance: No acute distress. Fully alert and oriented.  Skin: Warm. No ulcers or xanthomas.   HEENT: Grossly unremarkable. Lips and oral mucosa without pallor or cyanosis. Moist mucous membranes.   Neck Veins: Normal jugular venous pressure. Neck veins are not distended.  Carotid Arteries: Normal carotid upstroke bilaterally. No bruits.  Chest Inspection: Midline scar consistent  with prior sternotomy.    Auscultation/Percussion: Normal respiratory effort. Lungs clear to auscultation bilaterally. No wheezes, rales, or rhonchi.    Cardiac Rhythm: Regular rhythm, frequent extrasystolic beats in a bigeminal pattern. Normal rate.  Cardiac Auscultation: Normal S1 & S2. No S3 or S4. No rub.  Murmurs: No cardiac murmurs.  Peripheral Circulation: Normal peripheral circulation.   Extremities: Appropriately warm to touch. No lower extremity edema.  Neurologic Exam: Neurological assessment grossly intact.    Recent ECG:   Most recent results for 12-Lead ECG   ECG 12-LEAD    Collection Time: 03/15/22  1:42 PM   Result Value Status    VENTRICULAR RATE 78 Final    P-R INTERVAL 200 Final    QRS DURATION 86 Final    Q-T INTERVAL 404 Final    QTC CALCULATION (BAZETT) 460 Final    P AXIS 83 Final    R AXIS 52 Final    T AXIS 102 Final    Impression    Sinus rhythm with frequent premature ventricular complexes in a pattern of bigeminy  Poor R-wave progression  Abnormal ECG  When compared with ECG of 03-Feb-2022 09:00,  No significant change was found  Confirmed by Alda Lea ([id number]) on 03/15/2022 1:47:23 PM         Cardiovascular Health Factors  Vitals BP Readings from Last 3 Encounters:   03/15/22 122/56   02/03/22 138/60   02/26/21 (!) 148/74     Wt Readings from Last 3 Encounters:   03/15/22 88.7 kg (195 lb 9.6 oz)   02/03/22 87.3 kg (192 lb 6.4 oz)   02/26/21 90.7 kg (200 lb)     BMI Readings from Last 3 Encounters:   03/15/22 29.74 kg/m?   02/03/22 29.25 kg/m?   02/26/21 30.41 kg/m?      Smoking Social History     Tobacco Use   Smoking Status Never   Smokeless Tobacco Former    Types: Chew    Quit date: 02/01/1973      Lipid Profile Cholesterol   Date Value Ref Range Status   01/13/2022 111  Final     HDL   Date Value Ref Range Status   01/13/2022 41  Final     LDL   Date Value Ref Range Status   01/13/2022 52  Final     Triglycerides   Date Value Ref Range Status   01/13/2022 90  Final      Blood Sugar Hemoglobin A1C   Date Value Ref Range Status   07/06/2021 8.7  Final     Glucose   Date Value Ref Range Status   01/13/2022 166 (H) 70 - 105 Final   07/06/2021 205 (H) 70 - 105 Final   05/13/2021 211 (H) 70 - 105 Final     Glucose, POC   Date Value Ref Range Status   11/14/2005 127 (H) 70 - 110 MG/DL Final   16/11/9602 540 (H) 70 - 110 MG/DL Final   98/12/9145 829 (H) 70 - 110 MG/DL Final      10 Year ASCVD Risk The ASCVD Risk score (Arnett DK, et al., 2019) failed to calculate for the following reasons:    The patient has a prior MI or stroke diagnosis          Assessment and Plan        Dakodah Gulbrandsen Athanas is a 82 y.o. who was seen for the following problems:    Problems Addressed Today  Encounter  Diagnoses   Name Primary?    Screening for heart disease Yes    Primary hypertension     Palpitation     Coronary artery disease involving native heart without angina pectoris, unspecified vessel or lesion type     Type 2 diabetes mellitus without complication, without long-term current use of insulin (HCC)     Hx of CABG     Hyperlipidemia, unspecified hyperlipidemia type     TIA (transient ischemic attack)     Frequent PVCs        Frequent PVCs, mildly symptomatic: ECG today demonstrates monomorphic PVCs in a bigeminal fashion with recent monitor demonstrating a 25.5% PVC burden.  Recent MPI was considered intermediate risk and abnormal, which is a definite change compared to his prior exam.  He should proceed with left heart catheterization as previously scheduled to evaluate coronary artery anatomy. He should proceed with repeat echocardiogram as previously scheduled to assess LVEF given it appeared mildly reduced based off MPI exam.  Continue continue Lopressor 25 mg twice daily (limited by baseline bradycardia).  Further testing and management dependent upon above workup.  Could consider cardiac MRI for further evaluation, and he may require additional antiarrhythmic therapy and/or ablation procedure pending these results given concern for ischemic versus PVC induced systolic dysfunction.    Coronary artery disease status post coronary artery past grafting 2007: LIMA-LAD, free RIMA from LIMA to diagonal and obtuse marginal, SVG to RPDA.  LHC in 2010 with patent grafts.  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  Continue current medical therapy.  Abnormal MPI as noted above, therefore we will proceed with left heart catheterization.    Hypertension: Blood pressures appear fairly well-controlled.  Continue current therapy.    Diabetes mellitus, type II, not on insulin therapy, A1c 8.1: Follows with outside endocrinologist.  On SGLT2 inhibitor and DDP 4.  Has tried Trulicity in the past but insurance would not cover.  Currently not on a GLP-1 agonist.  Will defer ongoing management to his endocrinologist.    Hyperlipidemia, LDL 52, at goal: Continue atorvastatin and Zetia    History of TIA: On Plavix monotherapy    He will proceed with echocardiogram this week and left heart catheterization as scheduled next week.  He has scheduled follow-up with primary cardiologist Dr. Justice Britain in March.  Please call us in the meantime with any new questions or concerns.    Alda Lea, DO, FACC.  Staff Cardiologist  Available via pager [phone]) or Voalte    Total time spent on today's office visit was .  This includes over 50% face-to-face in person visit with patient as well as nonface-to-face time including review of the EMR, outside records, labs, radiologic studies, echocardiogram & other cardiovascular studies, formation of treatment plan, after visit summary, future disposition, and lastly on documentation.           Physical Exam   Constitutional: He appears well-developed. No distress.   HENT:   Head: Normocephalic and atraumatic.   Mouth/Throat: Mucous membranes are moist.   Eyes: No scleral icterus.   Neck: No JVD present. Carotid bruit is not present.   Cardiovascular: Normal rate and regular rhythm. Frequent extrasystoles are present.   No murmur heard.  Pulmonary/Chest: Effort normal and breath sounds normal. No respiratory distress. He has no wheezes.   Abdominal: Soft. Bowel sounds are normal. He exhibits no distension. There is no abdominal tenderness.   Musculoskeletal:      Right lower leg: No edema.  Left lower leg: No edema.   Neurological: He is alert and oriented to person, place, and time. Coordination normal.   Skin: Skin is warm and dry. He is not diaphoretic. No pallor.   Psychiatric: His behavior is normal. Mood, judgment and thought content normal.

## 2022-03-15 NOTE — Patient Instructions
It was nice to see you in clinic today.    We discussed:  Your PVCs or extra beats from the bottom chamber. These can be a sign of blockages in the coronary arteries or other scarring inside the heart. It is important for [place] to check the blood flow to the heart.   Lack of blood flow and extra beats can cause heart dysfunction, which is why we need to find out the heart function and treat accordingly.     I would like to make the following medication adjustments:  No Rx changes today.     Otherwise continue the same medications as you have been doing.    We will be pursuing the following tests after your appointment today:  Echo/Doppler (ultrasound of your heart looking at the structure, function, and valves of your heart)  Left heart catheterization.   Labs to include blood count, chemistry, magnesium and thyroid for your heart catheterization.     I will plan to see you back in about 2 months as scheduled.  Please call us in the meantime with any questions or concerns.    If there are any questions or concerns, our main line is [phone]. Our scheduling department can be reached at [phone].     Call the [place] nursing line at [phone].  Leave a detailed message for the nurse in [place] with how we can assist you and we will call you back.       If you have not heard or been advised of MyChart results of your testing in more than 1 week after it has been performed, please give [place] a call so that we may investigate further.    Lifestyle Modification recommendations:  Please continue to take your prescribed medications at the scheduled times on a regular basis.    Regular aerobic and weight bearing exercise is recommended at least 5 days a week for at least 30 minutes with a goal to exercise for 60 minutes. Ideally, exercise should be done daily if possible.     Eat a heart healthy, mediterranean style diet, focusing on fresh fruit, vegetables, nuts, legumes and avoidance of red or processed meat.   Limit the salt in your diet to less than 2,400 mg daily and calories in your diet.   Avoid Thissen saturated fat/Noviello cholesterol foods.  Increase soluble fiber intake as tolerated.     Limit alcohol intake to 1 drink daily for women and 2 drinks daily for men (1 drink = 5 ounces of wine, 12 ounces of beer, or 1 ounce of liquor).    Try to maintain an ideal body weight.    Do not smoke and avoid environments where people are smoking.    The American Heart Association has a great website with lots of important patient information about heart disease and other medical conditions including: G[place]inger blood pressure, cholesterol disorders, diabetes mellitus, and strokes.  I highly recommend you visit their site: www.heart.org    Follow up regularly with your primary care physician.

## 2022-03-16 ENCOUNTER — Encounter: Admit: 2022-03-16 | Discharge: 2022-03-16 | Payer: MEDICARE

## 2022-03-16 DIAGNOSIS — I493 Ventricular premature depolarization: Secondary | ICD-10-CM

## 2022-03-16 DIAGNOSIS — Z136 Encounter for screening for cardiovascular disorders: Secondary | ICD-10-CM

## 2022-03-16 DIAGNOSIS — I10 Essential (primary) hypertension: Secondary | ICD-10-CM

## 2022-03-16 DIAGNOSIS — I2510 Atherosclerotic heart disease of native coronary artery without angina pectoris: Secondary | ICD-10-CM

## 2022-03-16 DIAGNOSIS — R002 Palpitations: Secondary | ICD-10-CM

## 2022-03-16 DIAGNOSIS — E785 Hyperlipidemia, unspecified: Secondary | ICD-10-CM

## 2022-03-16 DIAGNOSIS — Z951 Presence of aortocoronary bypass graft: Secondary | ICD-10-CM

## 2022-03-16 DIAGNOSIS — E119 Type 2 diabetes mellitus without complications: Secondary | ICD-10-CM

## 2022-03-16 DIAGNOSIS — G459 Transient cerebral ischemic attack, unspecified: Secondary | ICD-10-CM

## 2022-03-16 LAB — MAGNESIUM: MAGNESIUM: 2.1

## 2022-03-16 LAB — CBC AND DIFF
ABSOLUTE EOS COUNT: 0.1
ABSOLUTE LYMPH COUNT: 2.3
ABSOLUTE MONO COUNT: 1 — ABNORMAL HIGH (ref 0.24–0.86)
ABSOLUTE NEUTROPHIL: 7.5 K/UL — ABNORMAL HIGH (ref 1.56–6.13)
BASOPHILS %: 0.2
EOSINOPHIL %: 1.4
HEMATOCRIT: 44 % (ref 41–77)
HEMOGLOBIN: 14 FL — ABNORMAL LOW (ref >60)
LYMPHOCYTES %: 21 K/UL (ref 0–0.20)
MCV: 95 % — ABNORMAL HIGH (ref 79.0–92.2)
MONOCYTES %: 9.7
RBC COUNT: 4.6 K/UL — ABNORMAL LOW (ref 4.63–6.08)
WBC COUNT: 11 % — ABNORMAL HIGH (ref 4.23–9.07)

## 2022-03-16 LAB — COMPREHENSIVE METABOLIC PANEL
ALBUMIN: 4.5
ALK PHOSPHATASE: 59
ALT: 31
ANION GAP: 13
AST: 25
BLD UREA NITROGEN: 23
CALCIUM: 10 — ABNORMAL HIGH (ref 8.8–10.0)
CHLORIDE: 102
CO2: 20 — ABNORMAL LOW (ref 23–31)
CREATININE: 0.9
GFR ESTIMATED: 80
GLUCOSE,PANEL: 170 — ABNORMAL HIGH (ref 70–105)
POTASSIUM: 4.9
SODIUM: 135 — ABNORMAL LOW (ref 136–145)
TOTAL BILIRUBIN: 0.8
TOTAL PROTEIN: 7.8

## 2022-03-16 LAB — TSH WITH FREE T4 REFLEX: THYROID SCREEN TSH: 1.7 ng/mL (ref 10–200)

## 2022-03-16 NOTE — Progress Notes
Medicare is listed as patient's primary insurance coverage.  Pre-certification is not required for hospitalizations.

## 2022-03-26 ENCOUNTER — Encounter: Admit: 2022-03-26 | Discharge: 2022-03-26 | Payer: MEDICARE

## 2022-03-26 DIAGNOSIS — E119 Type 2 diabetes mellitus without complications: Secondary | ICD-10-CM

## 2022-03-26 DIAGNOSIS — I2510 Atherosclerotic heart disease of native coronary artery without angina pectoris: Secondary | ICD-10-CM

## 2022-03-26 DIAGNOSIS — E785 Hyperlipidemia, unspecified: Secondary | ICD-10-CM

## 2022-03-26 DIAGNOSIS — I10 Essential (primary) hypertension: Secondary | ICD-10-CM

## 2022-03-26 MED ADMIN — SODIUM CHLORIDE 0.9 % IV SOLP [27838]: 500 mL | INTRAVENOUS | @ 15:00:00 | Stop: 2022-03-26 | NDC 00338004904

## 2022-03-26 MED ADMIN — SODIUM CHLORIDE 0.9 % IV SOLP [27838]: 1000.000 mL | INTRAVENOUS | @ 15:00:00 | Stop: 2022-03-26 | NDC 00338004904

## 2022-03-31 ENCOUNTER — Ambulatory Visit: Admit: 2022-03-31 | Discharge: 2022-03-31 | Payer: MEDICARE

## 2022-03-31 ENCOUNTER — Encounter: Admit: 2022-03-31 | Discharge: 2022-03-31 | Payer: MEDICARE

## 2022-03-31 DIAGNOSIS — Z136 Encounter for screening for cardiovascular disorders: Secondary | ICD-10-CM

## 2022-03-31 DIAGNOSIS — I10 Essential (primary) hypertension: Secondary | ICD-10-CM

## 2022-03-31 DIAGNOSIS — E119 Type 2 diabetes mellitus without complications: Secondary | ICD-10-CM

## 2022-03-31 DIAGNOSIS — R002 Palpitations: Secondary | ICD-10-CM

## 2022-03-31 DIAGNOSIS — I2510 Atherosclerotic heart disease of native coronary artery without angina pectoris: Secondary | ICD-10-CM

## 2022-03-31 DIAGNOSIS — I493 Ventricular premature depolarization: Secondary | ICD-10-CM

## 2022-03-31 DIAGNOSIS — E785 Hyperlipidemia, unspecified: Secondary | ICD-10-CM

## 2022-03-31 MED ORDER — MEXILETINE 200 MG PO CAP
200 mg | ORAL_CAPSULE | ORAL | 3 refills | Status: AC
Start: 2022-03-31 — End: ?

## 2022-04-02 ENCOUNTER — Encounter: Admit: 2022-04-02 | Discharge: 2022-04-02 | Payer: MEDICARE

## 2022-04-02 MED ORDER — MEXILETINE 200 MG PO CAP
200 mg | ORAL_CAPSULE | ORAL | 0 refills | Status: AC
Start: 2022-04-02 — End: ?

## 2022-04-02 NOTE — Telephone Encounter
-----   Message from Louis Meckel, LPN sent at [id number]  3:11 PM CST -----  Regarding: MPE- new Rx  VM on triage line from daughter Lattie Haw at 3:05pm.  Michela Pitcher that the [place]iletine was called into mail order and he was told it would take 2 weeks to get it.  Could you send short fill into [facility].  She is at [phone].

## 2022-04-02 NOTE — Telephone Encounter
RC to pt and spoke w/ Parker Ramos, Dtr. Called in short term script to local pharmacy as requested. Reviewed plan with the patient. Patient verbalized understanding and does not have any further questions or concerns. No further education requested from patient. Patient has our contact information for future needs.

## 2022-04-05 ENCOUNTER — Encounter: Admit: 2022-04-05 | Discharge: 2022-04-05 | Payer: MEDICARE

## 2022-04-07 ENCOUNTER — Encounter: Admit: 2022-04-07 | Discharge: 2022-04-07 | Payer: MEDICARE

## 2022-04-07 ENCOUNTER — Ambulatory Visit: Admit: 2022-04-07 | Discharge: 2022-04-07 | Payer: MEDICARE

## 2022-04-07 DIAGNOSIS — R002 Palpitations: Secondary | ICD-10-CM

## 2022-04-07 DIAGNOSIS — I493 Ventricular premature depolarization: Secondary | ICD-10-CM

## 2022-04-07 DIAGNOSIS — Z136 Encounter for screening for cardiovascular disorders: Secondary | ICD-10-CM

## 2022-04-07 DIAGNOSIS — I10 Essential (primary) hypertension: Secondary | ICD-10-CM

## 2022-04-07 NOTE — Progress Notes
To our valued patient,     We have enrolled your heart monitor and requested it be sent to your home.  You should receive this within 2-3 business days. Please wear the monitor for 7 days. When you have completed the study, please remove the device, and mail it back to the company. Please call [employer] Customer Service at [phone] with questions about placement, troubleshooting, and insurance coverage. You can reach the [facility] ambulatory heart monitor team at [phone].      Your Heart Rhythm Management Team  Cardiovascular Medicine Department at [facility]              Ambulatory (External) Cardiac Monitor Enrollment Record     Placement Location: Home Enrollment  Clinic Location: [facility]  Vendor: [employer] ([employer])  Mobile Cardiac Telemetry (MCOT/MCT)?: No  Duration of Monitor (in days): 7  Monitor Diagnosis: Other (Encounter for screening for cardiovascular disorders - Z13.6)  Secondary Monitor Diagnosis: Hypertension (I10)  Ordering Provider: [facility]  AMB Monitor Serial Number: home  No data recorded    Start Time and Date: 04/07/22 7:12 AM   Patient Name: Parker Ramos  DOB: [birth year]  MRN: [id number]  Sex: male  Mobile Phone Number: [phone] (mobile)  Home Phone Number: [phone]  Patient Address: [address] [place] [address]  Insurance Coverage: MEDICARE PART A AND B  Insurance ID: [id number]  Insurance Group #:   Insurance Subscriber: [name],[name]  Implanted Cardiac Device Information: No results found for: [id number]      Patient instructed to contact company phone number on the monitor box with questions regarding billing, placement, troubleshooting.     [name]    ____________________________________________________________    Clinic Staff:    Complete additional steps for documentation double check/Co-Sign.  In Follow-up, send chart upon closing encounter to P [facility] HRM AMBULATORY MONITORS    HRM Ambulatory Monitoring Team:  Schedule on appropriate template and check-in.   Clinic Placement Schedule on clinic location [facility] schedule   Home Enrollment Schedule on Home Enrollment schedule ([facility])   Given to patient in clinic for self-placement Schedule on Home Enrollment schedule ([facility])   Inpatient Schedule on [place] [facility] AMBULATORY MONITORING template   2. Please enroll with appropriate vendor.

## 2022-04-22 ENCOUNTER — Encounter: Admit: 2022-04-22 | Discharge: 2022-04-22 | Payer: MEDICARE

## 2022-04-22 DIAGNOSIS — Z136 Encounter for screening for cardiovascular disorders: Secondary | ICD-10-CM

## 2022-04-22 DIAGNOSIS — I10 Essential (primary) hypertension: Secondary | ICD-10-CM

## 2022-04-22 DIAGNOSIS — I493 Ventricular premature depolarization: Secondary | ICD-10-CM

## 2022-04-22 DIAGNOSIS — R002 Palpitations: Secondary | ICD-10-CM

## 2022-04-22 LAB — LIVER FUNCTION PANEL
ALBUMIN: 4.5 K/UL (ref 0–0.80)
ALK PHOSPHATASE: 78 K/UL (ref 0–0.45)
ALT: 22
AST: 19 K/UL (ref 0–0.20)
DIRECT BILIRUBIN: 0.3 K/UL (ref 1.0–4.8)
TOTAL BILIRUBIN: 0.8 K/UL — ABNORMAL LOW (ref 1.8–7.0)
TOTAL PROTEIN: 7.8

## 2022-04-27 ENCOUNTER — Encounter: Admit: 2022-04-27 | Discharge: 2022-04-27 | Payer: MEDICARE

## 2022-04-27 ENCOUNTER — Ambulatory Visit: Admit: 2022-04-27 | Discharge: 2022-04-28 | Payer: MEDICARE

## 2022-04-27 DIAGNOSIS — Z951 Presence of aortocoronary bypass graft: Secondary | ICD-10-CM

## 2022-04-27 DIAGNOSIS — I2510 Atherosclerotic heart disease of native coronary artery without angina pectoris: Secondary | ICD-10-CM

## 2022-04-27 DIAGNOSIS — E785 Hyperlipidemia, unspecified: Secondary | ICD-10-CM

## 2022-04-27 DIAGNOSIS — I10 Essential (primary) hypertension: Secondary | ICD-10-CM

## 2022-04-27 DIAGNOSIS — I493 Ventricular premature depolarization: Secondary | ICD-10-CM

## 2022-04-27 DIAGNOSIS — E119 Type 2 diabetes mellitus without complications: Secondary | ICD-10-CM

## 2022-04-27 NOTE — Progress Notes
Date of Service: 04/27/2022    Parker Ramos is a 82 y.o. male.       HPI   Parker Ramos is followed for coronary artery disease, hypertension, diabetes mellitus and hyperlipidemia.  He was seen in January 2024 when his primary care physician noted frequent ventricular ectopy.  A Holter monitor showed a PVC burden of 25.5%.  Echocardiography revealed mild left ventricular systolic dysfunction with an estimated ejection fraction of 45%.  Coronary angiography was performed without intervention.  Most recently he has been started on mexiletine although there was some delay in obtaining the medication.  He has had a follow-up event monitor with results pending.  He is having no significant adverse effects to the mexiletine.  Almost all of his current symptoms are related to arthritis.  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  He has seen pain therapy and has also seen a rheumatologist at [facility].  He gets a little relief from his tramadol.  His exercise tolerance continues to slowly worsen due to his arthritis.  It is apparent that he has difficulty transferring and ambulating.  From a cardiovascular perspective, the patient reports that he has been stable and he reports no angina, current congestive symptoms, palpitations, sensation of sustained forceful heart pounding, lightheadedness or syncope. The patient reports no myalgias, claudication, strokelike symptoms or bleeding abnormalities. His blood sugars vary with his diet.  He does not believe that he has diabetic neuropathy, retinopathy or nephropathy.      Historically, in the fall of 2007 Parker Ramos initially presented with symptoms of lightheadedness and a rapid heart beat. A stress test was performed and   was abnormal. He then underwent coronary angiography which revealed severe three-vessel coronary artery disease. He   underwent cardiac bypass surgery receiving a left internal mammary artery graft to the left anterior descending along   with a free right internal mammary artery T-graft off the LIMA to the diagonal and obtuse marginal branch of the   circumflex coronary artery. He also received a reverse saphenous vein graft to the posterior descending branch of the   right coronary artery. This was performed on November 08, 2005. The surgery was tolerated well and was uncomplicated.    In 2010 Parker Ramos underwent cardiac evaluation prior to left knee surgery. Coronary angiography was performed on 06/18/08 and revealed:    Severe multivessel native coronary artery disease.  Patent saphenous vein graft to PDA.  Patent LIMA to LAD with patent jump segments to the diagonal and obtuse marginal.  Normal left ventricular systolic function.      Parker Ramos developed left facial numbness rather abruptly on 06/07/11 at approximately 10:45 AM. It lasted until approximately 3:30 PM and it was not associated with motor abnormalities, visual abnormalities, hearing abnormalities, cognitive abnormalities or difficulty with speech. The patient reports that a CT scan, carotid duplex and MRA were performed and that a transient ischemic attack was suspected. He reports that he was seen by a neurologist who placed him on Plavix instead of aspirin. 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 He reports that he developed dyspnea on 04/14/12 and was seen in the Emergency Room. He was noted to have pulmonary vascular congestion and reportedly was administered IV furosemide with improvement and his hydrochlorothiazide was restarted. 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 He reports  that he developed hypotension after receiving Flomax and spent a night in the intensive care unit but reports no other complications such as chest discomfort, congestive heart failure or arrhythmias. On July 16, 2017 Parker Ramos woke up with mid abdominal pain and nausea.  He had occasional episodes of emesis.  The patient reports that he was evaluated on 07/18/2017 and was sent to the emergency room.  There is no evidence for an acute coronary syndrome.  The source of the discomfort was thought to be abdominal and he was placed on an H2 blocker (famotidine) with some gradual improvement.            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  It was either a baseline recording or random recording.  The patient's monitor demonstrated sinus rhythm with sinus bradycardia and frequent PVCs of different morphology with, at times, couplets or bigeminal rhythm.  There also is separate ventricular beats noted at a rate of about 40-44 beats per minute.  These were followed by PVCs and it appears to be competing with the sinus rhythm, maybe due to increased refractory period after the PVC.  No symptoms reported during these beats.      CAD (coronary artery disease) 06/17/2008     A.  11/02/2005 adenosine thallium stress test abnormal with reversible apical anterior perfusion abnormality. Abnormal ECG.  EF 60%.  b.     11/05/2005 cardiac cath left main normal.  LAD tubular 60 percent proximal stenosis and first diagonal 80 percent ostial stenosis.  Left   circumflex 80 percent ostial stenosis followed by 30 percent mid stenosis.  RCA mid 70 percent stenosis. PLV moderate stenosis.  EF  55 percent.  Mild apical lateral hypokinesis.  c.     11/08/2005 coronary artery bypass with LIMA to LAD, a free RIMA as T-graft off LIMA to diagonal, and then sequential to obtuse marginal  branch of circumflex. Separate SVG to right PDA.  D. 06/06/08: [facility]icon stress Spect- [facility]: mild intensity ischemia involving portions of the Ivy anterolateral and lateral wall. EF 55%  E. 06/20/08: Cardiac cath at [facility]: LM- mild plaque, LAD- Prox-mid 80-99%, LCx: 80 % proximal/ostial circumflex, which spans the takeoff of the first  small Alvidrez obtuse marginal branch. 60 % midportion of the circumflex. RCA: 100 % occluded distally, diffuse up to 50 % stenosis in the  proximal and mid segments of RCA.EF 55%. Patent LIMA to LAD, Patent SVG to PDA. 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       Separate  SVG to right PDA.        Type II diabetes mellitus (HCC) 06/17/2008    HTN (hypertension) 06/17/2008    HLD (hyperlipidemia) 06/17/2008         Review of Systems   Constitutional: Negative.   HENT: Negative.     Eyes: Negative.    Cardiovascular: Negative.    Respiratory: Negative.     Endocrine: Negative.    Hematologic/Lymphatic: Negative.    Skin: Negative.    Musculoskeletal: Negative.    Gastrointestinal: Negative.    Genitourinary: Negative.    Neurological: Negative.    Psychiatric/Behavioral: Negative.     Allergic/Immunologic: Negative.        Physical Exam  GENERAL: The patient is well developed, well nourished, resting comfortably and in no distress.   HEENT: No abnormalities of the visible oro-nasopharynx, conjunctiva or sclera are noted.  NECK: There is no jugular venous distension. Carotids are palpable and without bruits. There is no thyroid enlargement.  Chest: Lung fields are clear to auscultation. There are no wheezes or crackles.  CV: There is a regular rhythm. The first and second heart sounds are normal. There are no murmurs, gallops or rubs.  ABD: The abdomen is soft and supple with normal bowel sounds. There is no hepatosplenomegaly, ascites, tenderness, masses or bruits.  Neuro: There are no focal motor defects. Ambulation is slow and stooped.  Cognitive function appears normal.  He has difficulty transferring due to arthritis in multiple joints.  Ext: There is no edema or evidence of deep vein thrombosis. Peripheral pulses are satisfactory.  Difficulty with mobility from multiple arthritic joints is apparent.  SKIN: There are no rashes and no cellulitis  PSYCH: The patient is calm, rationale and oriented.    Cardiovascular Studies  A twelve-lead ECG obtained on March 23, 2022 reveals sinus bradycardia with a heart rate of 59 bpm.  A premature ventricular ectopic beat is seen.    Cardiovascular Health Factors  Vitals BP Readings from Last 3 Encounters:   04/27/22 (!) 141/73   03/31/22 118/64   03/26/22 119/64     Wt Readings from Last 3 Encounters:   04/27/22 85.5 kg (188 lb 6.4 oz)   03/31/22 86.6 kg (191 lb)   03/26/22 86.8 kg (191 lb 6.4 oz)     BMI Readings from Last 3 Encounters:   04/27/22 28.65 kg/m?   03/31/22 29.04 kg/m?   03/26/22 29.10 kg/m?      Smoking Social History     Tobacco Use   Smoking Status Never   Smokeless Tobacco Former    Types: Chew    Quit date: 02/01/1973      Lipid Profile Cholesterol   Date Value Ref Range Status   01/13/2022 111  Final     HDL   Date Value Ref Range Status   01/13/2022 41  Final     LDL   Date Value Ref Range Status   01/13/2022 52  Final     Triglycerides   Date Value Ref Range Status   01/13/2022 90  Final      Blood Sugar Hemoglobin A1C   Date Value Ref Range Status   07/06/2021 8.7  Final     Glucose   Date Value Ref Range Status   03/16/2022 170 (H) 70 - 105 Final   01/13/2022 166 (H) 70 - 105 Final   07/06/2021 205 (H) 70 - 105 Final     Glucose, POC   Date Value Ref Range  Status   03/26/2022 193 (H) 70 - 100 MG/DL Final   16/11/9602 540 (H) 70 - 110 MG/DL Final   98/12/9145 829 (H) 70 - 110 MG/DL Final          Problems Addressed Today  Coronary artery disease.  Heart failure with mildly reduced ejection fraction.  Frequent ventricular ectopy.  Hypercholesterolemia.    Assessment and Plan   Subjectively Mr. Stadler reports that he is doing well from a cardiovascular perspective.  He actually reports no angina or congestive symptoms and is not aware of palpitations.  He is taking mexiletine to reduce his burden of ventricular ectopy in case of ventricular ectopies leading to left ventricular systolic function.  However, his current left ventricular systolic dysfunction may be related to long-established coronary disease.  Mr. Cloonan is tolerating mexiletine without adverse effects.  He has an event monitor pending to see if mexiletine has been successful in reducing his burden of ventricular ectopy.  I have asked the patient to keep a log book of his BP readings and to report BP readings exceeding 130/80 mm Hg. Meanwhile, most of his symptoms relate to arthritis.  I have asked him to return for follow-up in 6 months time. The total time spent during this interview and exam with preparation and chart review was 30 minutes.         Current Medications (including today's revisions)   acetaminophen SR (TYLENOL) 650 mg tablet Take one tablet by mouth every 6 hours as needed for Pain.    amLODIPine (NORVASC) 2.5 mg tablet TAKE 1 TABLET EVERY DAY    amoxicillin (AMOXIL) 500 mg capsule Take one capsule by mouth as Needed. Take before dental procedures    atorvastatin (LIPITOR) 40 mg tablet Take one tablet by mouth daily.    calcium carbonate (TUMS PO) Take  by mouth.    CHOLEcalciferoL (vitamin D3) 1,000 units tablet Take one tablet by mouth daily.    clopiDOGrel (PLAVIX) 75 mg tablet Take one tablet by mouth daily.      ezetimibe (ZETIA) 10 mg tablet Take one tablet by mouth daily.    glimepiride (AMARYL) 2 mg tablet Take one tablet by mouth twice daily.    INVOKANA 300 mg tablet Take one tablet by mouth daily with breakfast.    JANUVIA 100 mg tablet Take one tablet by mouth daily.    levothyroxine (SYNTHROID) 25 mcg tablet Take one tablet by mouth daily.    metformin (GLUCOPHAGE) 1,000 mg tablet Take 1 Tab by mouth Twice Daily With Meals. DO NOT RESUME UNTIL THE EVENING OF MAY 20TH    metoprolol tartrate 25 mg tablet Take one tablet by mouth twice daily.    mexiletine (MEXITIL) 200 mg capsule Take one capsule by mouth every 8 hours.    nitroglycerin (NITROSTAT) 0.4 mg tablet Place 1 Tab under tongue every 5 minutes as needed for Chest Pain.    traMADoL (ULTRAM) 50 mg tablet Take one tablet by mouth as Needed.

## 2022-04-27 NOTE — Patient Instructions
Thank you for visiting our office today.    We would like to make the following medication adjustments:  NONE       Otherwise continue the same medications as you have been doing.          We will be pursuing the following tests after your appointment today:            We will plan to see you back in 6 months.  Please call us in the meantime with any questions or concerns.        Please allow 5-7 business days for our providers to review your results. All normal results will go to MyChart. If you do not have Mychart, it is strongly recommended to get this so you can easily view all your results. If you do not have mychart, we will attempt to call you once with normal lab and testing results. If we cannot reach you by phone with normal results, we will send you a letter.  If you have not heard the results of your testing after one week please give us a call.       Your Cardiovascular Medicine [place] Team (Steve, Lisa, Jamie, Melanie, and Jules Baty)  phone number is [phone].

## 2022-05-03 ENCOUNTER — Encounter: Admit: 2022-05-03 | Discharge: 2022-05-03 | Payer: MEDICARE

## 2022-05-03 NOTE — Telephone Encounter
Patient saw [facility] last week and confirmed compliance of mexiletine. Will await further recs from [facility].

## 2022-05-03 NOTE — Telephone Encounter
-----   Message from Berenice Primas, MD sent at 05/02/2022  1:37 PM CDT -----  Please contact Mr. Lardieri and see if he was taking mexiletine 200 mg 3 times daily as instructed at the time he was wearing his recent event monitor.    His event monitor still documented a Sharman PVC burden.  Please let him know that I will discuss the plan further with Dr. Jacklyn Shell.      For Dr. Jacklyn Shell and I:  I will discuss with Dr. Jacklyn Shell whether to pursue initiation of amiodarone or PVC ablation.  We will then notify the patient accordingly with regard to the plan.

## 2022-05-07 ENCOUNTER — Encounter: Admit: 2022-05-07 | Discharge: 2022-05-07 | Payer: MEDICARE

## 2022-05-10 ENCOUNTER — Encounter: Admit: 2022-05-10 | Discharge: 2022-05-10 | Payer: MEDICARE

## 2022-05-10 MED ORDER — ATORVASTATIN 40 MG PO TAB
40 mg | ORAL_TABLET | Freq: Every day | 1 refills | Status: AC
Start: 2022-05-10 — End: ?

## 2022-05-10 MED ORDER — METOPROLOL TARTRATE 25 MG PO TAB
25 mg | ORAL_TABLET | Freq: Two times a day (BID) | ORAL | 3 refills | 90.00000 days | Status: AC
Start: 2022-05-10 — End: ?

## 2022-05-10 NOTE — Telephone Encounter
-----   Message from [place] Meerdink sent at 05/10/2022 11:09 AM CDT -----  Regarding: Medication Clarfication  In [facility] for review.

## 2022-05-14 ENCOUNTER — Encounter: Admit: 2022-05-14 | Discharge: 2022-05-14 | Payer: MEDICARE

## 2022-05-14 DIAGNOSIS — I10 Essential (primary) hypertension: Secondary | ICD-10-CM

## 2022-05-14 DIAGNOSIS — E785 Hyperlipidemia, unspecified: Secondary | ICD-10-CM

## 2022-05-14 DIAGNOSIS — I2510 Atherosclerotic heart disease of native coronary artery without angina pectoris: Secondary | ICD-10-CM

## 2022-05-14 DIAGNOSIS — I493 Ventricular premature depolarization: Secondary | ICD-10-CM

## 2022-05-14 MED ORDER — LIDOCAINE (PF) 10 MG/ML (1 %) IJ SOLN
.2 mL | INTRAMUSCULAR | 0 refills | PRN
Start: 2022-05-14 — End: ?

## 2022-05-14 MED ORDER — LIDOCAINE HCL 2 % MM JELP
Freq: Once | TOPICAL | 0 refills
Start: 2022-05-14 — End: ?

## 2022-05-14 MED ORDER — SODIUM CHLORIDE 0.9 % IV SOLP
INTRAVENOUS | 0 refills
Start: 2022-05-14 — End: ?

## 2022-05-14 NOTE — Patient Instructions
ELECTROPHYSIOLOGY PRE-ADMISSION INSTRUCTIONS    Patient Name: Parker Ramos  MRN#: [id number]  Date of Birth: [birth year] (82 y.o.)  Today's Date: 05/14/2022    PROCEDURE:  You are scheduled for a Comprehensive Electrophysiology Study and  Radiofrequency ablation of Premature Ventricular Contractions (PVCs) with Dr. Adella Hare Dendi.      ARRIVAL TIME:  Please report to the [facility] admitting office on the ground floor of the [facility] on: Monday, 06/14/2022    The [facility] is located at [address], [place], [place] [address]. Park in [employer] and enter through the  Main front doors to the hospital. The Heart Center is located on the right-hand side as soon as you walk in.    The EP Lab will call to notify you of your arrival time.  They will call on the business day prior to your procedure.  (If you have any questions regarding your arrival time for the Electrophysiology Lab, please call the EP Lab at [phone].)        PRE-PROCEDURE APPOINTMENTS:    5/3 at 11:00 am   Office visit to obtain or update history and physical with  Raelyn Number, APRN  at the following [facility] clinic location: [facility]       5/3 after office visit     Pre-Admission lab work: BMP, CBC, and Magnesium at the [facility] [place] clinic.           SPECIAL MEDICATION INSTRUCTIONS  Nothing to eat or drink after midnight before your procedure.  Take your prescription medications with a sip of water as instructed.  No caffeine for 24 hours prior to your procedure.        Any new prescriptions will be sent to your pharmacy listed on file with [place].     HOLD ALL over the counter vitamins or supplements on the morning of your procedure.    Antiarrythmics: Mexitil (mexiletine) -- hold for 72 hours prior to your procedure. LAST DOSE: 5/9  Hypoglycemics: metformin (Glucophage) -- hold the morning of your procedure. , glimepiride (Amaryl) -- hold the morning of your procedure. , sitagliptin (Januvia) -- hold the morning of your procedure. , and Invokana -- hold the morning of your procedure.  Anticoagulants: clopidogrel (Plavix) -- continue taking uninterrupted.  Other: Metoprolol -- hold for 24 hours prior to your procedure.  LAST DOSE: 5/11.      Additional Instructions  If you wear CPAP, please bring your mask and machine with you to the hospital.    Take a bath or shower with anti-bacterial soap the evening before, or the morning of the procedure. We will give this to you at your office visit.     Bring photo ID and your health insurance card(s).    Arrange for a driver to take you home from the hospital.    Bring an accurate list of your current medications with you to the hospital (all meds and supplements taken daily).    Wear comfortable clothes and don't bring valuables, other than photo identification card, with you to the hospital.    Please pack a bag for an overnight stay.     Please review your pre-procedure instructions and call the office at [phone] with any questions. You may ask to speak with any of Dr. Adella Hare Dendi's nurses. There are several of [place] in the office that can assist you. For questions regarding post procedure care or restrictions please refer to  the Your Care Instructions included in the  a Comprehensive Electrophysiology Study and  Radiofrequency ablation of Premature Ventricular Contractions (PVCs) Packet.     ALLERGIES  Allergies   Allergen Reactions    Bee Sting [Allergen Ext-Venom-Honey Bee] ANAPHYLAXIS    Nickel SEE COMMENTS     Adverse reaction       CURRENT MEDICATIONS  Outpatient Encounter Medications as of 05/14/2022   Medication Sig Dispense Refill    acetaminophen SR (TYLENOL) 650 mg tablet Take one tablet by mouth every 6 hours as needed for Pain.      amLODIPine (NORVASC) 2.5 mg tablet TAKE 1 TABLET EVERY DAY 90 tablet 3    amoxicillin (AMOXIL) 500 mg capsule Take one capsule by mouth as Needed. Take before dental procedures      atorvastatin (LIPITOR) 40 mg tablet TAKE 1 TABLET EVERY DAY 90 tablet 1    calcium carbonate (TUMS PO) Take  by mouth.      CHOLEcalciferoL (vitamin D3) 1,000 units tablet Take one tablet by mouth daily.      clopiDOGrel (PLAVIX) 75 mg tablet Take one tablet by mouth daily.        ezetimibe (ZETIA) 10 mg tablet Take one tablet by mouth daily. 90 tablet 3    glimepiride (AMARYL) 2 mg tablet Take one tablet by mouth twice daily.      INVOKANA 300 mg tablet Take one tablet by mouth daily with breakfast.      JANUVIA 100 mg tablet Take one tablet by mouth daily.      levothyroxine (SYNTHROID) 25 mcg tablet Take one tablet by mouth daily.      metformin (GLUCOPHAGE) 1,000 mg tablet Take 1 Tab by mouth Twice Daily With Meals. DO NOT RESUME UNTIL THE EVENING OF MAY 20TH 1 Tab 0    metoprolol tartrate 25 mg tablet TAKE 1 TABLET TWICE DAILY 180 tablet 3    mexiletine (MEXITIL) 200 mg capsule Take one capsule by mouth every 8 hours. 45 capsule 0    nitroglycerin (NITROSTAT) 0.4 mg tablet Place 1 Tab under tongue every 5 minutes as needed for Chest Pain. 25 Tab 3    traMADoL (ULTRAM) 50 mg tablet Take one tablet by mouth as Needed.       No facility-administered encounter medications on file as of 05/14/2022.     _________________________________________  Form completed by: Darlen Round, RN  Date completed: 05/14/22  Method: Via MyChart.

## 2022-05-21 ENCOUNTER — Encounter: Admit: 2022-05-21 | Discharge: 2022-05-21 | Payer: MEDICARE

## 2022-05-21 NOTE — Progress Notes
Medicare is listed as patient's primary insurance coverage.  Pre-certification is not required for hospitalizations.

## 2022-05-31 ENCOUNTER — Encounter: Admit: 2022-05-31 | Discharge: 2022-05-31 | Payer: MEDICARE

## 2022-06-04 ENCOUNTER — Encounter: Admit: 2022-06-04 | Discharge: 2022-06-04 | Payer: MEDICARE

## 2022-06-04 ENCOUNTER — Ambulatory Visit: Admit: 2022-06-04 | Discharge: 2022-06-04 | Payer: MEDICARE

## 2022-06-04 DIAGNOSIS — I2510 Atherosclerotic heart disease of native coronary artery without angina pectoris: Secondary | ICD-10-CM

## 2022-06-04 DIAGNOSIS — E785 Hyperlipidemia, unspecified: Secondary | ICD-10-CM

## 2022-06-04 DIAGNOSIS — I493 Ventricular premature depolarization: Secondary | ICD-10-CM

## 2022-06-04 DIAGNOSIS — Z951 Presence of aortocoronary bypass graft: Secondary | ICD-10-CM

## 2022-06-04 DIAGNOSIS — I10 Essential (primary) hypertension: Secondary | ICD-10-CM

## 2022-06-04 DIAGNOSIS — E119 Type 2 diabetes mellitus without complications: Secondary | ICD-10-CM

## 2022-06-04 LAB — BASIC METABOLIC PANEL
ANION GAP: 11 % — ABNORMAL HIGH (ref 3–12)
BLD UREA NITROGEN: 26 mg/dL — ABNORMAL HIGH (ref 7–25)
CALCIUM: 9.9 mg/dL (ref 8.5–10.6)
CHLORIDE: 101 MMOL/L (ref 98–110)
CO2: 22 MMOL/L (ref 21–30)
CREATININE: 0.9 mg/dL (ref 0.4–1.24)
EGFR: >60 mL/min (ref >60)
GLUCOSE,PANEL: 222 mg/dL — ABNORMAL HIGH (ref 70–100)
POTASSIUM: 4.6 MMOL/L (ref 3.5–5.1)
SODIUM: 134 MMOL/L — ABNORMAL LOW (ref 137–147)

## 2022-06-04 LAB — CBC
HEMOGLOBIN: 13 g/dL — ABNORMAL LOW (ref 13.5–16.5)
RBC COUNT: 4.3 M/UL — ABNORMAL LOW (ref 4.4–5.5)
WBC COUNT: 7.9 K/UL (ref 4.5–11.0)

## 2022-06-04 LAB — MAGNESIUM: MAGNESIUM: 2.2 mg/dL (ref 1.6–2.6)

## 2022-06-04 NOTE — Progress Notes
Date of Service: 06/04/2022    Parker Ramos is a 82 y.o. male.       HPI    I had the pleasure of seeing Parker Ramos today for an updated history and physical prior to electrophysiology study and PVC ablation with Dr. Wallene Huh on 06/14/2022.  He is a very pleasant 82 year old gentleman with a past medical history of coronary artery disease s/p CABG in 2007, frequent PVCs, TIA, hypertension, dyslipidemia, type 2 diabetes, polyarthralgia and degenerative disc disease and chronic back pain.    He saw his PCP in January 2020 for infrequent ventricular ectopy was noted.  A Holter monitor showed a PVC burden of 25.5%.  Echocardiogram showed mild left ventricular systolic dysfunction with LVEF 45%.  Coronary angiography was performed in February 2024 and he had diffuse severe LAD disease extending distally and severe circumflex disease with a failed jump graft.  The PLV had severe disease, but was a small vessel and filled retrogradely from the PDA graft.  Dr. Chales Abrahams thought that there was limited benefit and probably more risk and trying to intervene on the mid to distal LAD severe diffuse disease through a tortuous LIMA graft.  The circumflex could have been stented, but since he was not having angina, it was not felt to be warranted.  He was started on mexiletine 200 mg every 8 hours and a follow-up monitor showed a 25.2% PVC burden.  Dr. Arna Medici and Dr. Naoma Diener reviewed and recommended electrophysiology study and PVC ablation.    Parker Ramos comes in with his daughter today.  He denies any fevers, chills, cough or other sign of infection.  He does not feel any palpitations.  He uses a cane and reports a lot of joint and generalized pain.  He is not terribly active.  He tells me that almost every joint in his body has been injured.  He denies any chest pain or pressure or unusual shortness of breath.  He occasionally notes some lightheadedness with standing and walking.  He notes some leg edema if he stands for longer periods of time.       Vitals:    06/04/22 1116   BP: 124/74   BP Source: Arm, Right Upper   Pulse: 66   SpO2: 98%   O2 Device: None (Room air)   PainSc: Zero   Weight: 81.2 kg (179 lb)   Height: 172.7 cm (5' 8)     Body mass index is 27.22 kg/m?Marland Kitchen     Past Medical History  Patient Active Problem List    Diagnosis Date Noted    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  It was either a baseline recording or random recording.  The patient's monitor demonstrated sinus rhythm with sinus bradycardia and frequent PVCs of different morphology with, at times, couplets or bigeminal rhythm.  There also is separate ventricular beats noted at a rate of about 40-44 beats per minute.  These were followed by PVCs and it appears to be competing with the sinus rhythm, maybe due to increased refractory period after the PVC.  No symptoms reported during these beats.      CAD (coronary artery disease) 06/17/2008     A.  11/02/2005 adenosine thallium stress test abnormal with reversible apical anterior  perfusion abnormality. Abnormal ECG.  EF 60%.  b.     11/05/2005 cardiac cath left main normal.  LAD tubular 60 percent proximal stenosis and first diagonal 80 percent ostial stenosis.  Left   circumflex 80 percent ostial stenosis followed by 30 percent mid stenosis.  RCA mid 70 percent stenosis. PLV moderate stenosis.  EF  55 percent.  Mild apical lateral hypokinesis.  c.     11/08/2005 coronary artery bypass with LIMA to LAD, a free RIMA as T-graft off LIMA to diagonal, and then sequential to obtuse marginal  branch of circumflex. Separate SVG to right PDA.  D. 06/06/08: Lexicon stress Spect- [facility]: mild intensity ischemia involving portions of the [place] anterolateral and lateral wall. EF 55%  E. 06/20/08: Cardiac cath at [place]: LM- mild plaque, LAD- Prox-mid 80-99%, LCx: 80 % proximal/ostial circumflex, which spans the takeoff of the first  small [place] obtuse marginal branch. 60 % midportion of the circumflex. RCA: 100 % occluded distally, diffuse up to 50 % stenosis in the  proximal and mid segments of RCA.EF 55%. Patent LIMA to LAD, Patent SVG to PDA. 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       Separate SVG to right PDA.        Type II diabetes mellitus (HCC) 06/17/2008    HTN (hypertension) 06/17/2008    HLD (hyperlipidemia) 06/17/2008     Review of Systems   Constitutional: Negative.   HENT: Negative.     Eyes: Negative.    Cardiovascular: Negative.    Respiratory: Negative.     Endocrine: Negative.    Hematologic/Lymphatic: Negative.    Skin: Negative.    Musculoskeletal: Negative.    Gastrointestinal: Negative.    Genitourinary: Negative.    Neurological: Negative.    Psychiatric/Behavioral: Negative.     Allergic/Immunologic: Negative.        Physical Exam  General Appearance: no acute distress, using a cane  Skin: warm & intact  HEENT: unremarkable  Neck Veins: neck veins are flat & not distended  Auscultation/Percussion: lungs clear to auscultation, no rales, rhonchi, or wheezing  Cardiac Rhythm: irregular rhythm & normal rate  Cardiac Auscultation: Normal S1 & S2, no S3 or S4, no rub  Murmurs: no cardiac murmurs   Extremities: no lower extremity edema; 2+ symmetric distal pulses  Abdominal Exam: soft, non-tender, bowel sounds normal  Neurologic Exam: oriented to time, place and person; no focal neurologic deficits  Psychiatric: Normal mood and affect.  Behavior is normal. Judgment and thought content normal.     Cardiovascular Studies  ECG was not repeated today.    Cardiovascular Health Factors  Vitals BP Readings from Last 3 Encounters:   06/04/22 124/74   04/27/22 (!) 141/73   03/31/22 118/64     Wt Readings from Last 3 Encounters:   06/04/22 81.2 kg (179 lb)   04/27/22 85.5 kg (188 lb 6.4 oz)   03/31/22 86.6 kg (191 lb)     BMI Readings from Last 3 Encounters:   06/04/22 27.22 kg/m?   04/27/22 28.65 kg/m?   03/31/22 29.04 kg/m?      Smoking Social History     Tobacco Use   Smoking Status Never   Smokeless Tobacco Former  Types: Chew    Quit date: 02/01/1973      Lipid Profile Cholesterol   Date Value Ref Range Status   01/13/2022 111  Final     HDL   Date Value Ref Range Status   01/13/2022 41  Final     LDL   Date Value Ref Range Status   01/13/2022 52  Final     Triglycerides   Date Value Ref Range Status   01/13/2022 90  Final      Blood Sugar Hemoglobin A1C   Date Value Ref Range Status   07/06/2021 8.7  Final     Glucose   Date Value Ref Range Status   03/16/2022 170 (H) 70 - 105 Final   01/13/2022 166 (H) 70 - 105 Final   07/06/2021 205 (H) 70 - 105 Final     Glucose, POC   Date Value Ref Range Status   03/26/2022 193 (H) 70 - 100 MG/DL Final   16/11/9602 540 (H) 70 - 110 MG/DL Final   98/12/9145 829 (H) 70 - 110 MG/DL Final        Problems Addressed Today  Encounter Diagnoses   Name Primary?    Frequent PVCs Yes    Hx of CABG      Assessment and Plan     In conclusion, Mr. Biga was noted to have frequent ectopy when he saw his PCP in January 2024.  A Holter monitor showed a 25.5% PVC burden.  An echocardiogram showed a decline in his ejection fraction to 45%.  He underwent heart catheterization and PCI was not recommended.  He was started on mexiletine and a follow-up monitor showed 25.2% PVC burden.  Dr. Arna Medici and Dr. Naoma Diener reviewed and recommended proceeding with an electrophysiology study and PVC ablation.  Preprocedure instructions were printed and we reviewed them together today in clinic.  I gave him a bottle of surgical scrub to bathe with the evening before or morning of his procedure.  His preprocedure labs will be drawn after this office visit today.  We gave him a patient education packet on PVC ablation.  I do not see any reason he cannot proceed with his procedure as planned.  I encouraged him to contact our clinic with any questions or concerns which may arise prior to his procedure.         Current Medications (including today's revisions)   acetaminophen SR (TYLENOL) 650 mg tablet Take one tablet by mouth every 6 hours as needed for Pain.    amLODIPine (NORVASC) 2.5 mg tablet TAKE 1 TABLET EVERY DAY    amoxicillin (AMOXIL) 500 mg capsule Take one capsule by mouth as Needed. Take before dental procedures    atorvastatin (LIPITOR) 40 mg tablet TAKE 1 TABLET EVERY DAY    calcium carbonate (TUMS PO) Take  by mouth.    CHOLEcalciferoL (vitamin D3) 1,000 units tablet Take one tablet by mouth daily.    clopiDOGrel (PLAVIX) 75 mg tablet Take one tablet by mouth daily.      ezetimibe (ZETIA) 10 mg tablet Take one tablet by mouth daily.    glimepiride (AMARYL) 2 mg tablet Take one tablet by mouth twice daily.    INVOKANA 300 mg tablet Take one tablet by mouth daily with breakfast.    JANUVIA 100 mg tablet Take one tablet by mouth daily.    levothyroxine (SYNTHROID) 25 mcg tablet Take one tablet by mouth daily.    metformin (GLUCOPHAGE) 1,000 mg tablet Take 1  Tab by mouth Twice Daily With Meals. DO NOT RESUME UNTIL THE EVENING OF MAY 20TH    metoprolol tartrate 25 mg tablet TAKE 1 TABLET TWICE DAILY    mexiletine (MEXITIL) 200 mg capsule Take one capsule by mouth every 8 hours.    nitroglycerin (NITROSTAT) 0.4 mg tablet Place 1 Tab under tongue every 5 minutes as needed for Chest Pain.    traMADoL (ULTRAM) 50 mg tablet Take one tablet by mouth as Needed.

## 2022-06-10 ENCOUNTER — Encounter: Admit: 2022-06-10 | Discharge: 2022-06-10 | Payer: MEDICARE

## 2022-06-14 ENCOUNTER — Encounter: Admit: 2022-06-14 | Discharge: 2022-06-14 | Payer: MEDICARE

## 2022-06-14 MED ADMIN — SODIUM CHLORIDE 0.9 % IV SOLP [27838]: 297 mL | @ 18:00:00 | Stop: 2022-06-14 | NDC 00338004904

## 2022-06-14 MED ADMIN — HEPARIN (PORCINE) 1,000 UNIT/ML IJ SOLN [10176]: 80 mL | @ 18:00:00 | Stop: 2022-06-14 | NDC 25021040001

## 2022-06-14 MED ADMIN — ATORVASTATIN 40 MG PO TAB [77113]: 40 mg | ORAL | @ 21:00:00 | NDC 68084009911

## 2022-06-14 MED ADMIN — EZETIMIBE 10 MG PO TAB [86887]: 10 mg | ORAL | @ 21:00:00 | NDC 67877049090

## 2022-06-14 MED ADMIN — HEPARIN (PORCINE) 1,000 UNIT/ML IJ SOLN [10176]: 297 mL | @ 18:00:00 | Stop: 2022-06-14 | NDC 25021040001

## 2022-06-14 MED ADMIN — SODIUM CHLORIDE 0.9 % IV SOLP [27838]: 80 mL | @ 18:00:00 | Stop: 2022-06-14 | NDC 00338004904

## 2022-06-14 MED ADMIN — SODIUM CHLORIDE 0.9 % IV SOLP [27838]: 300 mL | @ 18:00:00 | Stop: 2022-06-14 | NDC 00338004904

## 2022-06-14 MED ADMIN — HEPARIN (PORCINE) 1,000 UNIT/ML IJ SOLN [10176]: 300 mL | @ 18:00:00 | Stop: 2022-06-14 | NDC 25021040001

## 2022-06-14 MED ADMIN — MORPHINE 4 MG/ML IV SYRG [320654]: 2 mg | INTRAVENOUS | @ 19:00:00 | Stop: 2022-06-14 | NDC 00409189103

## 2022-06-14 MED ADMIN — GLIMEPIRIDE 4 MG PO TAB [16357]: 2 mg | ORAL | NDC 68084032711

## 2022-06-14 MED ADMIN — SODIUM CHLORIDE 0.9 % IV SOLP [27838]: 1000.000 mL | INTRAVENOUS | @ 14:00:00 | Stop: 2022-06-16 | NDC 00338004904

## 2022-06-14 NOTE — Anesthesia Pre-Procedure Evaluation
Anesthesia Pre-Procedure Evaluation    Name: Parker Ramos      MRN: [id number]     DOB: [birth year]     Age: 82 y.o.     Sex: male   _________________________________________________________________________     Procedure Info:   Procedure Information       Date/Time: 06/14/22 1024    Procedure: INTRACARDIAC CATHETER ABLATION WITH COMPREHENSIVE ELECTROPHYSIOLOGIC EVALUATION AND 3-DIMENSIONAL MAPPING - PREMATURE VENTRICULAR CONTRACTION    Location: [facility] / [facility]    Providers: Jen Mow, MD            Physical Assessment  Vital Signs (last filed in past 24 hours):  BP: 157/94 (05/13 0801)  Temp: 36.7 ?C (98 ?F) (05/13 0803)  Pulse: 78 (05/13 0803)  SpO2: 99 % (05/13 0803)  O2 Device: None (Room air) (05/13 0803)  Height: 172.7 cm (5' 8) (05/13 0803)  Weight: 82.3 kg (181 lb 7 oz) (05/13 0803)      Patient History   Allergies   Allergen Reactions    Bee Sting [Allergen Ext-Venom-Honey Bee] ANAPHYLAXIS    Nickel SEE COMMENTS     Adverse reaction        Current Medications    Medication Directions   acetaminophen SR (TYLENOL) 650 mg tablet Take one tablet by mouth every 6 hours as needed for Pain.   amLODIPine (NORVASC) 2.5 mg tablet TAKE 1 TABLET EVERY DAY   amoxicillin (AMOXIL) 500 mg capsule Take one capsule by mouth as Needed. Take before dental procedures   atorvastatin (LIPITOR) 40 mg tablet TAKE 1 TABLET EVERY DAY   calcium carbonate (TUMS PO) Take  by mouth.   CHOLEcalciferoL (vitamin D3) 1,000 units tablet Take one tablet by mouth daily.   clopiDOGrel (PLAVIX) 75 mg tablet Take one tablet by mouth daily.     ezetimibe (ZETIA) 10 mg tablet Take one tablet by mouth daily.   glimepiride (AMARYL) 2 mg tablet Take one tablet by mouth twice daily.   INVOKANA 300 mg tablet Take one tablet by mouth daily with breakfast.   JANUVIA 100 mg tablet Take one tablet by mouth daily.   levothyroxine (SYNTHROID) 25 mcg tablet Take one tablet by mouth daily.   metformin (GLUCOPHAGE) 1,000 mg tablet Take 1 Tab by mouth Twice Daily With Meals. DO NOT RESUME UNTIL THE EVENING OF MAY 20TH   metoprolol tartrate 25 mg tablet TAKE 1 TABLET TWICE DAILY   mexiletine (MEXITIL) 200 mg capsule Take one capsule by mouth every 8 hours.   nitroglycerin (NITROSTAT) 0.4 mg tablet Place 1 Tab under tongue every 5 minutes as needed for Chest Pain.   traMADoL (ULTRAM) 50 mg tablet Take one tablet by mouth as Needed.       Review of Systems/Medical History        PONV Screening: Non-smoker        Pulmonary           Cardiovascular          Beta Blocker therapy: Yes      Beta blockers within 24 hours: Yes      Hypertension          Coronary artery disease      Coronary artery bypass graft            Dysrhythmias      Hyperlipidemia        Neuro/Psych         Hx TIA      Musculoskeletal  Arthritis:         Endocrine/Other       Diabetes        Hypothyroidism           Physical Exam    Airway Findings      Mallampati: III      TM distance: >3 FB      Neck ROM: full      Mouth opening: good      Airway patency: adequate    Dental Findings: Negative      Cardiovascular Findings:       Rhythm: irregular      Rate: normal    Pulmonary Findings:       Breath sounds clear to auscultation.    Abdominal Findings:         Abdomen soft    Neurological Findings:       Alert and oriented x 3    Constitutional findings:       No acute distress       Previous Airway Procedure Notes Displaying the 3 most recent records   No records found.         Patient Lines/Drains/Airways Status       Active Lines:       Name Placement date Placement time Site Days    Peripheral IV 06/14/22 0903 Right Mid Forearm 20 G 06/14/22  0903  -- less than 1                  Diagnostic Tests  Hematology:   Lab Results   Component Value Date    HGB 13.3 06/04/2022    HCT 39.4 06/04/2022    PLTCT 236 06/04/2022    WBC 7.9 06/04/2022    NEUT 67.3 03/16/2022    ANC 7.54 03/16/2022    ALC 2.37 03/16/2022    MONA 9.7 03/16/2022    AMC 1.09 03/16/2022    EOSA 3 11/12/2005    ABC <0.04 03/16/2022    MCV 91.7 06/04/2022    MCH 30.8 06/04/2022    MCHC 33.6 06/04/2022    MPV 9.1 06/04/2022    RDW 15.3 06/04/2022         General Chemistry:   Lab Results   Component Value Date    NA 134 06/04/2022    K 4.6 06/04/2022    CL 101 06/04/2022    CO2 22 06/04/2022    GAP 11 06/04/2022    BUN 26 06/04/2022    CR 0.95 06/04/2022    GLU 222 06/04/2022    CA 9.9 06/04/2022    ALBUMIN 4.5 04/22/2022    OBSCA 1.16 11/09/2005    MG 2.2 06/04/2022    TOTBILI 0.83 04/22/2022    PO4 1.7 11/11/2005      Coagulation:   Lab Results   Component Value Date    PT 13.6 11/08/2005    PTT 27.2 11/08/2005    INR 1.0 06/14/2022    INR 1.4 11/08/2005     Echo 2/24    Interpretation Summary    The left ventricle is normal in size with mildly reduced LV systolic function, visually estimated to be 45% with diffuse hypokinesis and abnormal septal motion.  Grade 1 (mild) diastolic dysfunction.  The right ventricle is normal in size with moderately reduced systolic function.  Left atrium is moderately dilated.  Mild right atrial dilation.  Mild to moderate tricuspid regurgitation.  Otherwise no hemodynamically significant valvular stenosis or regurgitation is identified.  No  pericardial effusion.  Estimated PASP is 44 mmHg.  Elevated estimated CVP at 5-10 mmHg.  No prior studies are currently available for direct visual comparison.    PAC Plan    Anesthesia Plan    ASA score: 3   Plan: general  Induction method: intravenous  NPO status: acceptable      Informed Consent  Anesthetic plan and risks discussed with patient.  Use of blood products discussed with patient  Blood Consent: consented      Plan discussed with: anesthesiologist and CRNA.      Alerts

## 2022-06-15 MED ADMIN — DAPAGLIFLOZIN PROPANEDIOL 10 MG PO TAB [320172]: 10 mg | ORAL | @ 13:00:00 | Stop: 2022-06-15 | NDC 00310621039

## 2022-06-15 MED ADMIN — SITAGLIPTIN PHOSPHATE 100 MG PO TAB [135916]: 100 mg | ORAL | @ 13:00:00 | Stop: 2022-06-15 | NDC 00006027701

## 2022-06-15 MED ADMIN — ATORVASTATIN 40 MG PO TAB [77113]: 40 mg | ORAL | @ 13:00:00 | Stop: 2022-06-15 | NDC 68084009911

## 2022-06-15 MED ADMIN — METFORMIN 500 MG PO TAB [10544]: 1000 mg | ORAL | @ 13:00:00 | Stop: 2022-06-15 | NDC 70010006301

## 2022-06-15 MED ADMIN — METOPROLOL TARTRATE 25 MG PO TAB [37637]: 25 mg | ORAL | @ 01:00:00 | NDC 51079025501

## 2022-06-15 MED ADMIN — EZETIMIBE 10 MG PO TAB [86887]: 10 mg | ORAL | @ 13:00:00 | Stop: 2022-06-15 | NDC 67877049090

## 2022-06-15 MED ADMIN — METOPROLOL TARTRATE 25 MG PO TAB [37637]: 25 mg | ORAL | @ 13:00:00 | Stop: 2022-06-15 | NDC 51079025501

## 2022-06-15 MED ADMIN — ASPIRIN 81 MG PO TBEC [14113]: 81 mg | ORAL | @ 13:00:00 | Stop: 2022-06-15 | NDC 63739021202

## 2022-06-15 MED ADMIN — CLOPIDOGREL 75 MG PO TAB [78966]: 75 mg | ORAL | @ 13:00:00 | Stop: 2022-06-15 | NDC 00904629461

## 2022-06-15 MED ADMIN — AMLODIPINE 5 MG PO TAB [79041]: 2.5 mg | ORAL | @ 13:00:00 | Stop: 2022-06-15 | NDC 00904637061

## 2022-06-15 MED ADMIN — ACETAMINOPHEN 325 MG PO TAB [101]: 650 mg | ORAL | @ 11:00:00 | Stop: 2022-06-15 | NDC 00904677361

## 2022-06-15 MED ADMIN — GLIMEPIRIDE 4 MG PO TAB [16357]: 2 mg | ORAL | @ 13:00:00 | Stop: 2022-06-15 | NDC 68084032711

## 2022-06-15 MED ADMIN — LEVOTHYROXINE 25 MCG PO TAB [4420]: 25 ug | ORAL | @ 12:00:00 | Stop: 2022-06-15 | NDC 68180096509

## 2022-06-15 MED ADMIN — FENTANYL CITRATE (PF) 50 MCG/ML IJ SOLN [3037]: 50 ug | INTRAVENOUS | @ 02:00:00 | Stop: 2022-06-15 | NDC 00409909412

## 2022-06-16 ENCOUNTER — Encounter: Admit: 2022-06-16 | Discharge: 2022-06-16 | Payer: MEDICARE

## 2022-06-16 DIAGNOSIS — I10 Essential (primary) hypertension: Secondary | ICD-10-CM

## 2022-06-16 DIAGNOSIS — I2510 Atherosclerotic heart disease of native coronary artery without angina pectoris: Secondary | ICD-10-CM

## 2022-06-16 DIAGNOSIS — E119 Type 2 diabetes mellitus without complications: Secondary | ICD-10-CM

## 2022-06-16 DIAGNOSIS — E785 Hyperlipidemia, unspecified: Secondary | ICD-10-CM

## 2022-07-11 ENCOUNTER — Inpatient Hospital Stay: Admit: 2022-07-11 | Payer: MEDICARE

## 2022-07-11 ENCOUNTER — Encounter: Admit: 2022-07-11 | Discharge: 2022-07-11 | Payer: MEDICARE

## 2022-07-11 DIAGNOSIS — K2289 Esophageal dilatation: Secondary | ICD-10-CM

## 2022-07-11 LAB — COMPREHENSIVE METABOLIC PANEL
ALBUMIN: 4.5 g/dL (ref 3.5–5.0)
ALK PHOSPHATASE: 67 U/L (ref 25–110)
ALT: 22 U/L (ref 7–56)
ANION GAP: 20 — ABNORMAL HIGH (ref 3–12)
AST: 19 U/L (ref 7–40)
BLD UREA NITROGEN: 24 mg/dL (ref 7–25)
CALCIUM: 10 mg/dL — ABNORMAL HIGH (ref 8.5–10.6)
CHLORIDE: 100 MMOL/L (ref 98–110)
CO2: 16 MMOL/L — ABNORMAL LOW (ref 21–30)
CREATININE: 0.8 mg/dL (ref 0.4–1.24)
EGFR: >60 mL/min (ref >60)
GLUCOSE,PANEL: 243 mg/dL — ABNORMAL HIGH (ref 70–100)
POTASSIUM: 4.3 MMOL/L (ref 3.5–5.1)
SODIUM: 136 MMOL/L — ABNORMAL LOW (ref <20.7)
TOTAL BILIRUBIN: 0.8 mg/dL (ref 0.2–1.3)
TOTAL PROTEIN: 8.1 g/dL — ABNORMAL HIGH (ref 6.0–8.0)

## 2022-07-11 LAB — POC GLUCOSE
POC GLUCOSE: 240 mg/dL — ABNORMAL HIGH (ref 70–100)
POC GLUCOSE: 223 mg/dL — ABNORMAL HIGH (ref 70–100)

## 2022-07-11 LAB — HIGH SENSITIVITY TROPONIN I 0 HOUR: HIGH SENSITIVITY TROPONIN I 0 HOUR: 20 ng/L — ABNORMAL HIGH (ref <20)

## 2022-07-11 LAB — HIGH SENSITIVITY TROPONIN I 4 HR
HI SEN TNI 4 HR: 22 ng/L — ABNORMAL HIGH (ref <20)
HI SEN TNI DELTA 4-2: 2

## 2022-07-11 LAB — HIGH SENSITIVITY TROPONIN I 2 HOUR: HIGH SENSITIVITY TROPONIN I 2 HOUR: 20 ng/L — ABNORMAL HIGH (ref <20)

## 2022-07-11 LAB — CBC: WBC COUNT: 14 K/UL — ABNORMAL HIGH (ref 4.5–11.0)

## 2022-07-11 MED ORDER — SENNOSIDES-DOCUSATE SODIUM 8.6-50 MG PO TAB
1 | Freq: Every day | ORAL | 0 refills | Status: AC | PRN
Start: 2022-07-11 — End: ?

## 2022-07-11 MED ORDER — ONDANSETRON 4 MG PO TBDI
4 mg | ORAL | 0 refills | Status: AC | PRN
Start: 2022-07-11 — End: ?
  Administered 2022-07-13: 13:00:00 4 mg via ORAL

## 2022-07-11 MED ORDER — MELATONIN 5 MG PO TAB
5 mg | Freq: Every evening | ORAL | 0 refills | Status: AC | PRN
Start: 2022-07-11 — End: ?

## 2022-07-11 MED ORDER — INSULIN ASPART 100 UNIT/ML SC FLEXPEN
0-6 [IU] | Freq: Before meals | SUBCUTANEOUS | 0 refills | Status: AC
Start: 2022-07-11 — End: ?
  Administered 2022-07-11: 2 [IU] via SUBCUTANEOUS

## 2022-07-11 MED ORDER — POLYETHYLENE GLYCOL 3350 17 GRAM PO PWPK
1 | Freq: Every day | ORAL | 0 refills | Status: AC | PRN
Start: 2022-07-11 — End: ?

## 2022-07-11 MED ORDER — METOPROLOL TARTRATE 25 MG PO TAB
25 mg | Freq: Two times a day (BID) | ORAL | 0 refills | Status: AC
Start: 2022-07-11 — End: ?
  Administered 2022-07-12 – 2022-07-14 (×4): 25 mg via ORAL

## 2022-07-11 MED ORDER — ACETAMINOPHEN 325 MG PO TAB
650 mg | ORAL | 0 refills | Status: AC | PRN
Start: 2022-07-11 — End: ?

## 2022-07-11 MED ORDER — ONDANSETRON HCL (PF) 4 MG/2 ML IJ SOLN
4 mg | INTRAVENOUS | 0 refills | Status: AC | PRN
Start: 2022-07-11 — End: ?

## 2022-07-11 MED ORDER — DEXTROSE 50 % IN WATER (D50W) IV SYRG
12.5-25 g | INTRAVENOUS | 0 refills | Status: AC | PRN
Start: 2022-07-11 — End: ?

## 2022-07-11 MED ORDER — LEVOTHYROXINE 25 MCG PO TAB
25 ug | Freq: Every day | ORAL | 0 refills | Status: AC
Start: 2022-07-11 — End: ?
  Administered 2022-07-12 – 2022-07-13 (×2): 25 ug via ORAL

## 2022-07-11 MED ORDER — EZETIMIBE 10 MG PO TAB
10 mg | Freq: Every day | ORAL | 0 refills | Status: AC
Start: 2022-07-11 — End: ?
  Administered 2022-07-12 – 2022-07-13 (×2): 10 mg via ORAL

## 2022-07-11 MED ORDER — ATORVASTATIN 40 MG PO TAB
40 mg | Freq: Every day | ORAL | 0 refills | Status: AC
Start: 2022-07-11 — End: ?
  Administered 2022-07-12 – 2022-07-13 (×2): 40 mg via ORAL

## 2022-07-11 MED ORDER — AMLODIPINE 2.5 MG PO TAB
2.5 mg | Freq: Every day | ORAL | 0 refills | Status: AC
Start: 2022-07-11 — End: ?
  Administered 2022-07-12: 18:00:00 2.5 mg via ORAL

## 2022-07-11 MED ORDER — LACTATED RINGERS IV SOLP
INTRAVENOUS | 0 refills | Status: AC
Start: 2022-07-11 — End: ?
  Administered 2022-07-11 – 2022-07-13 (×3): 1000.000 mL via INTRAVENOUS

## 2022-07-11 NOTE — Progress Notes
Patient with PMH of CAD, CABG in 2007, HTN, TIA, HTN, HLD, DM Type II, degenerative disc disease, and chronic back pain is in ED with intractable nausea and vomiting. CT reveals esophageal dilation and possible obstruction due to severe achalasia. Patient has received IVF, Zofran, Promethazine, and Insulin 8 units for blood glucose in 200s. This transfer request is initiated for an emergent GI consult.

## 2022-07-11 NOTE — H&P (View-Only)
Name:  Parker Ramos                                             MRN:  [id number]   Admission Date:  07/11/2022                     Assessment/Plan:      Esophageal dilation   - patient reports some esophageal problems, but can't specify what exactly, he follows with outside GI   - went to [facility] with epigastric abd pain and vomiting , also had dysphagia, but can eat and drink wo issues other than mid discomfort after eating   - CT abdomen and chest WC at [facility] 07/11/2022:the esophagus is patulous, and dilated with air fluid level  - keep strict NPO   - Gi consulted for EGD     Coronary artery disease   - s/p CABG in 2007  - HS trop pending  - ecg pending     DM 2  - dm diet when able  - low dose correction    Frequent PVCs with a burden of 25%  - s/p  PVC ablation with Dr. Wallene Huh 06/2022  - continue metoprolol    HTN  - norvasc and metoprolol     HLD  - statin and zetia     Hypothyroidism  - synthroid       ______________________________________________________________________________    Primary Care Physician: Hoyt Koch     Chief Complaint: transferred from [facility] for the management of esophageal dilation.     History of Present Illness: Parker Ramos is a 82 y.o. male with PMH of as below presented to [facility] today with cc of lower chest and epigastric abd pain started this morning, associated with vomiting, CT reveals esophageal dilation and possible obstruction due to severe achalasia patient has hx of achalasia, follows with gi, but didn't know when his last egd was and if he ever had dilation. He denies any dyspnea, fever, Reports also dysphagia and pain with swallowing.     Past Medical History:   Diagnosis Date    CAD (coronary artery disease) 06/17/2008    HLD (hyperlipidemia) 06/17/2008    HTN (hypertension) 06/17/2008    Personal history of CABG (coronary artery bypass graft) 06/17/2008    Type II diabetes mellitus (HCC) 06/17/2008     Surgical History:   Procedure Laterality Date    HERNIA REPAIR  2002    ROTATOR CUFF REPAIR 2004    right    CARDIAC SURGERY  2007    bypass    ANGIOGRAPHY CORONARY ARTERY AND ARTERIAL/VENOUS GRAFTS WITH LEFT HEART CATHETERIZATION N/A 03/26/2022    Performed by Harley Alto, MD at [facility] CATH LAB    PERCUTANEOUS CORONARY STENT PLACEMENT WITH ANGIOPLASTY N/A 03/26/2022    Performed by Harley Alto, MD at [facility] CATH LAB    INTRACARDIAC CATHETER ABLATION WITH COMPREHENSIVE ELECTROPHYSIOLOGIC EVALUATION AND 3-DIMENSIONAL MAPPING - PREMATURE VENTRICULAR CONTRACTION N/A 06/14/2022    Performed by Jen Mow, MD at [facility] EP LAB    HX JOINT REPLACEMENT Right     knee    KNEE SURGERY      left and right knee were scoped     Family History   Problem Relation Name Age of Onset    Diabetes Mother      Diabetes Father  Diabetes Brother      Cancer Brother       Social History     Socioeconomic History    Marital status: Divorced   Tobacco Use    Smoking status: Never    Smokeless tobacco: Former     Types: Chew     Quit date: 02/01/1973   Vaping Use    Vaping status: Never Used   Substance and Sexual Activity    Alcohol use: No    Drug use: No      Immunizations (includes history and patient reported):   Immunization History   Administered Date(s) Administered    COVID-19 (MODERNA), mRNA vacc, 100 mcg/0.5 mL (PF) 03/15/2019, 04/12/2019, 12/11/2019, 06/13/2020    Covid-19 Bivalent (61yr+)(MODERNA), mRNA Vacc, 50 mcg/0.5 mL (PF) 12/12/2020           Allergies:  Bee sting [allergen ext-venom-honey bee] and Nickel    Medications:  Medications Prior to Admission   Medication Sig    acetaminophen SR (TYLENOL) 650 mg tablet Take one tablet by mouth every 6 hours as needed for Pain.    amLODIPine (NORVASC) 2.5 mg tablet TAKE 1 TABLET EVERY DAY    amoxicillin (AMOXIL) 500 mg capsule Take one capsule by mouth as Needed. Take before dental procedures    aspirin EC (ASPIR-LOW) 81 mg tablet Take one tablet by mouth daily for 30 days. Indications: s/p ablation procedure    atorvastatin (LIPITOR) 40 mg tablet TAKE 1 TABLET EVERY DAY calcium carbonate (TUMS PO) Take  by mouth.    CHOLEcalciferoL (vitamin D3) 1,000 units tablet Take one tablet by mouth daily.    clopiDOGrel (PLAVIX) 75 mg tablet Take one tablet by mouth daily.      ezetimibe (ZETIA) 10 mg tablet Take one tablet by mouth daily.    glimepiride (AMARYL) 2 mg tablet Take one tablet by mouth twice daily.    INVOKANA 300 mg tablet Take one tablet by mouth daily with breakfast.    JANUVIA 100 mg tablet Take one tablet by mouth daily.    levothyroxine (SYNTHROID) 25 mcg tablet Take one tablet by mouth daily.    metformin (GLUCOPHAGE) 1,000 mg tablet Take 1 Tab by mouth Twice Daily With Meals. DO NOT RESUME UNTIL THE EVENING OF MAY 20TH    metoprolol tartrate 25 mg tablet TAKE 1 TABLET TWICE DAILY    nitroglycerin (NITROSTAT) 0.4 mg tablet Place 1 Tab under tongue every 5 minutes as needed for Chest Pain.    traMADoL (ULTRAM) 50 mg tablet Take one tablet by mouth as Needed.     Review of Systems:  A comprehensive  12 point review of organ systems reviewed and was negative except for the ones mentioned in HOPI    Physical Exam:  Vital Signs: Last Filed In 24 Hours Vital Signs: 24 Hour Range   BP: 154/61 (06/09 1637)  Temp: 36.6 ?C (97.8 ?F) (06/09 1637)  Pulse: 76 (06/09 1637)  Respirations: 18 PER MINUTE (06/09 1637)  SpO2: 98 % (06/09 1637)  O2 Device: None (Room air) (06/09 1637) BP: (154)/(61)   Temp:  [36.6 ?C (97.8 ?F)]   Pulse:  [76]   Respirations:  [18 PER MINUTE]   SpO2:  [98 %]   O2 Device: None (Room air)          General:  Alert, awake, oriented x 3 , cooperative, no distress, appears stated age  Head:  Normocephalic, without obvious abnormality, atraumatic  Eyes:  Conjunctivae/corneas clear   Nose:  Nares normal. Mucosa normal.  No drainage or sinus tenderness  Throat: Lips, mucosa and tongue normal  Neck:    Supple, symmetrical, trachea midline, no adenopathy, thyroid: no enlargement/tenderness/nodules  Lungs:  Clear to auscultation bilaterally  Heart:   Regular rate and rhythm, S1, S2 normal, no murmur  Abdomen:  Soft, non-tender.  Bowel sounds normal.  No masses.  No organomegaly. No guarding, no rebound   Extremities: Extremities normal, atraumatic, no cyanosis or edema  Skin: Skin color, texture, turgor normal.    Lymph nodes:  Cervical, supraclavicular and axillary nodes normal  Neurologic: Non focal grossly    Lab/Radiology/Other Diagnostic Tests:  24-hour labs:  No results found for this visit on 07/11/22 (from the past 24 hour(s)).     Pertinent radiology reviewed.    No results found.

## 2022-07-12 ENCOUNTER — Inpatient Hospital Stay: Admit: 2022-07-12 | Discharge: 2022-07-12 | Payer: MEDICARE

## 2022-07-12 MED ADMIN — BARIUM SULFATE 40 % (W/V) PO SUSP [95015]: 120 mL | ORAL | @ 21:00:00 | Stop: 2022-07-12 | NDC 32909011500

## 2022-07-12 MED ADMIN — BARIUM SULFATE 700 MG PO TAB [301158]: 700 mg | ORAL | @ 21:00:00 | Stop: 2022-07-12 | NDC 10361077831

## 2022-07-12 NOTE — Care Plan
Problem: Discharge Planning  Goal: Participation in plan of care  Outcome: Goal Ongoing  Flowsheets (Taken 07/11/2022 2329)  Participation in Plan of Care: Involve patient/caregiver in care planning decision making  Goal: Knowledge regarding plan of care  Outcome: Goal Ongoing  Flowsheets (Taken 07/11/2022 2329)  Knowledge regarding plan of care:   Provide admission education to parent/caregiver   Provide fall prevention education   Provide VTE signs and symptoms education   Provide plan of care education   Provide infection prevention education   Provide pre-operative teaching   Provide procedural and treatment education   Provide medication management education  Goal: Prepared for discharge  Outcome: Goal Ongoing  Flowsheets (Taken 07/11/2022 2329)  Prepared for discharge:   Complete ADL ability assessment   Provide safe use medical equipment education   Provide discharge activity restrictions education   Collaborate with multidisciplinary team for hospital discharge coordination   Provide diet and oral health education   Provide discharge materials appropriate to patient condition    Glendell Docker, RN

## 2022-07-13 MED ADMIN — CYANOCOBALAMIN (VITAMIN B-12) 1,000 MCG/ML IJ SOLN [2007]: 1000 ug | INTRAMUSCULAR | @ 22:00:00 | Stop: 2022-07-20 | NDC 70512084001

## 2022-07-13 MED ADMIN — AMLODIPINE 5 MG PO TAB [79041]: 5 mg | ORAL | @ 13:00:00 | NDC 00904637061

## 2022-07-13 MED ADMIN — ENOXAPARIN 40 MG/0.4 ML SC SYRG [85052]: 40 mg | SUBCUTANEOUS | @ 01:00:00 | Stop: 2022-07-15 | NDC 00781324602

## 2022-07-13 MED ADMIN — ASPIRIN 81 MG PO CHEW [680]: 81 mg | ORAL | @ 14:00:00 | NDC 00904679430

## 2022-07-14 ENCOUNTER — Encounter: Admit: 2022-07-14 | Discharge: 2022-07-14 | Payer: MEDICARE

## 2022-07-14 ENCOUNTER — Inpatient Hospital Stay: Admit: 2022-07-14 | Discharge: 2022-07-14 | Payer: MEDICARE

## 2022-07-14 DIAGNOSIS — E785 Hyperlipidemia, unspecified: Secondary | ICD-10-CM

## 2022-07-14 DIAGNOSIS — I2510 Atherosclerotic heart disease of native coronary artery without angina pectoris: Secondary | ICD-10-CM

## 2022-07-14 DIAGNOSIS — E119 Type 2 diabetes mellitus without complications: Secondary | ICD-10-CM

## 2022-07-14 DIAGNOSIS — I10 Essential (primary) hypertension: Secondary | ICD-10-CM

## 2022-07-14 MED ORDER — LIDOCAINE (PF) 20 MG/ML (2 %) IJ SOLN
INTRAVENOUS | 0 refills | Status: DC
Start: 2022-07-14 — End: 2022-07-14

## 2022-07-14 MED ORDER — LACTATED RINGERS IV SOLP
INTRAVENOUS | 0 refills | Status: DC
Start: 2022-07-14 — End: 2022-07-14

## 2022-07-14 MED ORDER — PROPOFOL 10 MG/ML IV EMUL 20 ML (INFUSION)(AM)(OR)
INTRAVENOUS | 0 refills | Status: DC
Start: 2022-07-14 — End: 2022-07-14
  Administered 2022-07-14: 14:00:00 120 ug/kg/min via INTRAVENOUS

## 2022-07-14 MED ADMIN — PANTOPRAZOLE 40 MG PO TBEC [80436]: 40 mg | ORAL | @ 02:00:00 | NDC 00904647461

## 2022-07-14 MED ADMIN — ENOXAPARIN 40 MG/0.4 ML SC SYRG [85052]: 40 mg | SUBCUTANEOUS | @ 02:00:00 | Stop: 2022-07-14 | NDC 00781324602

## 2022-07-14 NOTE — Anesthesia Post-Procedure Evaluation
Post-Anesthesia Evaluation    Name: Parker Ramos      MRN: [id number]     DOB: 1940/06/17     Age: 82 y.o.     Sex: male   __________________________________________________________________________     Procedure Information       Anesthesia Start Date/Time: 07/14/22 0856    Procedure: ESOPHAGOGASTRODUODENOSCOPY WITH TRANSENDOSCOPIC BALLOON DILATION - LESS THAN 30 MM DIAMETER    Location: ENDO 3 / ENDO/GI    Surgeons: Samuel Jester, MD            Post-Anesthesia Vitals  BP: 109/63 (06/12 0925)  Temp: 36.9 ?C (98.5 ?F) (06/12 0915)  Pulse: 70 (06/12 0925)  Respirations: 18 PER MINUTE (06/12 0925)  SpO2: 97 % (06/12 0925)  O2 Device: None (Room air) (06/12 0925)   Vitals Value Taken Time   BP 109/63 07/14/22 0925   Temp 36.9 ?C (98.5 ?F) 07/14/22 0915   Pulse 70 07/14/22 0925   Respirations 18 PER MINUTE 07/14/22 0925   SpO2 97 % 07/14/22 0925   O2 Device None (Room air) 07/14/22 0925   ABP     ART BP           Post Anesthesia Evaluation Note    Evaluation location: pre/post  Patient participation: recovered; patient participated in evaluation  Level of consciousness: alert    Pain score: 0  Pain management: adequate    Hydration: normovolemia  Temperature: 36.0?C - 38.4?C  Airway patency: adequate    Perioperative Events       Post-op nausea and vomiting: no PONV    Postoperative Status  Cardiovascular status: hemodynamically stable  Respiratory status: spontaneous ventilation  Follow-up needed: none    Perioperative Events  There were no known complications for this encounter.

## 2022-07-15 ENCOUNTER — Encounter: Admit: 2022-07-15 | Discharge: 2022-07-15 | Payer: MEDICARE

## 2022-07-15 DIAGNOSIS — Q396 Congenital diverticulum of esophagus: Secondary | ICD-10-CM

## 2022-07-16 ENCOUNTER — Encounter: Admit: 2022-07-16 | Discharge: 2022-07-16 | Payer: MEDICARE

## 2022-07-16 ENCOUNTER — Ambulatory Visit: Admit: 2022-07-16 | Discharge: 2022-07-16 | Payer: MEDICARE

## 2022-07-16 DIAGNOSIS — E785 Hyperlipidemia, unspecified: Secondary | ICD-10-CM

## 2022-07-16 DIAGNOSIS — I10 Essential (primary) hypertension: Secondary | ICD-10-CM

## 2022-07-16 DIAGNOSIS — I2510 Atherosclerotic heart disease of native coronary artery without angina pectoris: Secondary | ICD-10-CM

## 2022-07-16 DIAGNOSIS — E119 Type 2 diabetes mellitus without complications: Secondary | ICD-10-CM

## 2022-08-17 ENCOUNTER — Encounter: Admit: 2022-08-17 | Discharge: 2022-08-17 | Payer: MEDICARE

## 2022-08-17 MED ORDER — EZETIMIBE 10 MG PO TAB
10 mg | ORAL_TABLET | Freq: Every day | 3 refills | Status: AC
Start: 2022-08-17 — End: ?

## 2022-08-25 ENCOUNTER — Encounter: Admit: 2022-08-25 | Discharge: 2022-08-25 | Payer: MEDICARE

## 2022-08-31 ENCOUNTER — Encounter: Admit: 2022-08-31 | Discharge: 2022-08-31 | Payer: MEDICARE

## 2022-09-09 ENCOUNTER — Encounter: Admit: 2022-09-09 | Discharge: 2022-09-09 | Payer: MEDICARE

## 2022-09-09 DIAGNOSIS — I2699 Other pulmonary embolism without acute cor pulmonale: Secondary | ICD-10-CM

## 2022-09-09 DIAGNOSIS — M1990 Unspecified osteoarthritis, unspecified site: Secondary | ICD-10-CM

## 2022-09-09 DIAGNOSIS — E119 Type 2 diabetes mellitus without complications: Secondary | ICD-10-CM

## 2022-09-09 DIAGNOSIS — K219 Gastro-esophageal reflux disease without esophagitis: Secondary | ICD-10-CM

## 2022-09-09 DIAGNOSIS — I2510 Atherosclerotic heart disease of native coronary artery without angina pectoris: Secondary | ICD-10-CM

## 2022-09-09 DIAGNOSIS — I10 Essential (primary) hypertension: Secondary | ICD-10-CM

## 2022-09-09 DIAGNOSIS — G459 Transient cerebral ischemic attack, unspecified: Secondary | ICD-10-CM

## 2022-09-09 DIAGNOSIS — E785 Hyperlipidemia, unspecified: Secondary | ICD-10-CM

## 2022-09-13 NOTE — Progress Notes
Date of Service: 09/15/2022    Name: Parker Ramos          DOB: 1940-04-04          MRN: [id number]    Referring Provider:  Dr. Sharl Ma  Cardiologist: Dr. Dedra Skeens   PCP: Dr. Steva Ready (outside records)  Endocrinologist: Dr. 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 He is an 82 year old male with a history of coronary artery disease s/p CABG (2007, [place] w/ Dr. Marylouise Stacks), hypertension, DM II (on oral medication), GERD, hypothyroidism and hyperlipidemia. He reports ever since his CABG in 2007 he has had issues with dysphagia. He feels that things get stuck/hung up mid chest. Typically thick solids like meat/steak or if he is eating quickly and doesn't chew his food well. He will drink a glass of milk and his symptoms resolve and it feels as if the food has passed. He reports that once he started experiencing these symptoms he underwent a swallow study and was diagnosed with an esophageal diverticulum. He feels over the years his symptoms have not significantly worsened. He typically has feelings of dysphagia about 2x/month. This is not associated with any regurgitation. Has no issues with liquids or pills. He denies any weight loss associated with this. He did have acid reflux symptoms that have been alleviated since starting a PPI.     In June he presented to the ED for hyperglycemia. He had associated nausea/vomiting and felt quickly better the day after. During that admission he underwent additional evaluation with esophagram showing known diverticulum and EGD showing this mid esopahgeal diverticulum as well.     He does have significant other medical problems, notably his CAD. Earlier this year he began having PVCs. This prompted a stress test which was concerning for ischemia. He underwent a LHC in 03/2022 with Dr. Chales Abrahams showing severe coronary artery disease that was not amenable to percutaneous or surgical intervention and was recommended to have ongoing medical management - he remains on a statin and aspirin.     He has history of a TIA in ~2011 and has been on plavix since then for that.     He lives at home by himself and utilizes a walker (occasionally a cane) for ambulation. He reports he does not do anything strenuous. Largely watches tv. Unassisted he can walk ~15 feet. He is limited by joint pain (back, knees, hips). He lives at home by himself but has multiple daughters that live nearby.    Work up summary:     07/11/2022-CT C/A/P [facility]) on Image Viewer: Esophagus is patulous and dilated. 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 There is no significant retained   barium column within the esophagus to suggest achalasia.     07/14/2022-EGD ([place])         Allergies reviewed and verified with the patient, and documented in Epic:  Reviewed on 07/14/2022. No known drug allergies.     Prior Treatment (XRT, Surgery, Chemotherapy):  NA     He is here today to discuss possible surgical interventions.  History   Past Medical History:   Diagnosis Date    CAD (coronary artery disease) 06/17/2008    DJD (degenerative joint disease)     GERD (gastroesophageal reflux disease)     HLD (hyperlipidemia) 06/17/2008    HTN (hypertension) 06/17/2008    PE (pulmonary thromboembolism) (HCC)     Personal history of CABG (coronary artery bypass graft) 06/17/2008    TIA (transient ischemic attack)     Type II diabetes mellitus (HCC) 06/17/2008     Past Surgical History:  Inguinal hernia repair  Right rotator cuff  CABG (2007)  Knee replacement  Bilateral knee scopes    Social History     Socioeconomic History    Marital status: Divorced Tobacco Use    Smoking status: Former     Types: Cigarettes    Smokeless tobacco: Former     Types: Chew     Quit date: 02/01/1973   Vaping Use    Vaping status: Never Used   Substance and Sexual Activity    Alcohol use: No    Drug use: No     Vaping/E-liquid Use    Vaping Use Never User               Family History   Problem Relation Name Age of Onset    Diabetes Mother      Diabetes Father      Diabetes Brother      Cancer Brother       Allergies   Allergen Reactions    Bee Sting [Allergen Ext-Venom-Honey Bee] ANAPHYLAXIS    Nickel SEE COMMENTS     Adverse reaction     Immunization History   Administered Date(s) Administered    COVID-19 (MODERNA), mRNA vacc, 100 mcg/0.5 mL (PF) 03/15/2019, 04/12/2019, 12/11/2019, 06/13/2020    Covid-19 Bivalent (6yr+)(MODERNA), mRNA Vacc, 50 mcg/0.5 mL (PF) 12/12/2020    Pneumococcal Vaccine (23-Val Adult) 08/20/2019    Pneumococcal Vaccine(13-Val Peds/immunocompromised adult) 01/14/2014    Zoster Vaccine Recombinant, Adjuvanted (shingles) IM (vial 2 of 2)(SHINGRIX) 02/02/2022         Medications:    acetaminophen SR (TYLENOL) 650 mg tablet Take one tablet by mouth every 6 hours as needed for Pain.    amLODIPine (NORVASC) 5 mg tablet Take one tablet by mouth daily. Indications: [place] blood pressure    amoxicillin (AMOXIL) 500 mg capsule Take four capsules by mouth as Needed. Take before dental procedures    artificial tears multi dose drop ophthalmic solution Apply one drop to both eyes as Needed for Dry Eyes.    atorvastatin (LIPITOR) 40 mg tablet TAKE 1 TABLET EVERY DAY (Patient taking differently: Take one-half tablet by mouth daily.)    calcium carbonate (TUMS PO) Take 1 tablet by mouth as Needed.    CHOLEcalciferoL (vitamin D3) (VITAMIN D3) 50 mcg (2,000 unit) tablet Take one tablet by mouth daily.    clopiDOGrel (PLAVIX) 75 mg tablet Take one tablet by mouth daily.      cyanocobalamin (vitamin B-12) (RUBRAMIN PC) 1,000 mcg/mL injection solution Inject  into the muscle every 30 days.    ezetimibe (ZETIA) 10 mg tablet TAKE 1 TABLET EVERY DAY    glimepiride (AMARYL) 2 mg tablet Take one tablet by mouth twice daily.    INVOKANA 300 mg tablet Take one tablet by mouth daily with breakfast.    JANUVIA 100 mg tablet Take one tablet by mouth daily.    levothyroxine (SYNTHROID) 25 mcg tablet Take one tablet by mouth daily.  metformin (GLUCOPHAGE) 1,000 mg tablet Take 1 Tab by mouth Twice Daily With Meals. DO NOT RESUME UNTIL THE EVENING OF MAY 20TH    metoprolol tartrate 25 mg tablet TAKE 1 TABLET TWICE DAILY    nitroglycerin (NITROSTAT) 0.4 mg tablet Place 1 Tab under tongue every 5 minutes as needed for Chest Pain.    pantoprazole DR (PROTONIX) 40 mg tablet Take one tablet by mouth daily. Indications: gastroesophageal reflux disease       I have reviewed the PTA meds and they are correct.    Anti-coagulation: clopidogrel (Plavix) and aspirin 81mg   Occupation: retired     Engineer, water Status: 2, Ambulatory and capable of all selfcare but unable to carry out any work activities. Up and about more than 50% of waking hours     Clinical Nutrition Status  PO Intake compared to normal: Normal  Weight loss last 3 months:  0 lbs  Estimated body mass index is 28.22 kg/m? as calculated from the following:    Height as of this encounter: 172.7 cm (5' 8).    Weight as of this encounter: 84.2 kg (185 lb 9.6 oz).  BMI class: Overweight (25 to <30)  Patient demonstrates no malnutrition      ROS  Constitutional: Negative.   HENT: Negative.     Eyes: Negative.    Cardiovascular: Negative.    Respiratory: Negative.     Endocrine: Negative.    Hematologic/Lymphatic: Negative.    Skin: Negative.    Musculoskeletal: Negative.    Gastrointestinal: Negative.    Genitourinary: Negative.    Neurological: Negative.    Psychiatric/Behavioral: Negative.     Allergic/Immunologic: Negative.       Review of systems Obtained from patient    Physical Exam   Constitutional: He appears well-developed.  Non-toxic appearance. No distress.   HENT:   Head: Normocephalic and atraumatic.   Nose: No congestion. Mouth/Throat: Mucous membranes are moist.   Cardiovascular: Normal rate, regular rhythm and normal pulses.   No murmur heard.  Pulmonary/Chest: Effort normal and breath sounds normal. He has no wheezes. He has no rhonchi. He has no rales.   Abdominal: Soft. He exhibits no distension. There is no abdominal tenderness.   Musculoskeletal:         General: No swelling. Normal range of motion.   Neurological: He is alert.   Skin: Skin is warm. Capillary refill takes 2 to 3 seconds.   Psychiatric: His behavior is normal. Mood normal.       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 His symptoms are limited to a few times a month, weight remains stable, and he is able to identify triggers for his dysphagia and makes appropriate modifications. In addition, his overall status in regards to ECOG status as well as known stress test and LHC positive for severe coronary artery disease gives [place] pause for aggressive surgical intervention given his relatively manageable symptoms.     We discussed this with him and his daughter, Parker Ramos and they are in agreement.     - may follow up with [place] PRN but would otherwise continue managing with dietary changes as he has         ATTESTATION     I have personally interviewed and examined the patient.  I have reviewed and agree with the history, physical exam, impression and plan as outlined by the Nurse Practitioner.      I had the pleasure of seeing Parker Ramos today in thoracic surgery clinic, where he was accompanied by his daughter.  As you are aware, he is a very pleasant 82 year old male with a past medical history notable for coronary artery disease status post CABG, type 2 diabetes, hypertension, and hypothyroidism, who is being evaluated for a known esophageal diverticulum.  Patient reports the onset of mild dysphagia immediately after undergoing prior CABG in 2007 around which time he notes being told of the diverticulum.  Although the patient did have a recent hospitalization secondary to complaints of epigastric abdominal pain with nausea and vomiting, he feels this was unrelated to his chronic diverticulum.  He reports very minimal symptoms attributable to the diverticulum itself, noting occasional episodes of dysphagia only a few times a month with specific identifiable triggers.  His weight has been stable for some time.    The patient's prior studies were reviewed, including a CT from 07/11/2022 that demonstrated a patulous and dilated esopahgus in addition to mildly prominent mediastinal lymphadenopathy. A timed barium esophagram from 07/12/2022 demonstrated a large, widemouth diverticulum arising from the right lateral margin of the lower esophagus.  Notably, there was no significant retained barium column within the esophagus to suggest underlying achalasia and the barium tablet passed quickly into the stomach.  An EGD on 07/14/2022 by Dr. Allena Katz described an extremely tortuous distal esophagus with a large diverticulum present around 38 cm, which had mucosal changes suggestive of stasis.  There were also findings of LA grade B esophagitis within the distal esophagus, where biopsies were obtained and demonstrated only mild focal chronic inflammation.    I reviewed the above studies with the patient and discussed that he has clear radiographic and endoscopic evidence of a relatively large mid to lower esophageal diverticulum.  I discussed that the exact etiology of the diverticulum is unclear, as his timed barium swallow does not suggest any underlying motility disorder as is seen with an epiphrenic diverticulum and it also does not have the classic appearance of traction diverticulum.  Regardless, the patient has very minimal symptoms related to the diverticulum and, thus, I do not feel that any surgical intervention is warranted at this time.  Moreover, the patient's limited functional status and concomitant comorbidities make him [place] risk for any surgery, further supporting conservative management over an operation that offers limited  potential benefit.  The patient and his daughter are understanding and agreeable to the above plan, all of their questions were answered.    I appreciate the opportunity to participate in this patient's care.  Please feel free to contact my office with any further questions or concerns.    [place] Adem Costlow, MD  Thoracic Surgery

## 2022-09-15 ENCOUNTER — Encounter: Admit: 2022-09-15 | Discharge: 2022-09-15 | Payer: MEDICARE

## 2022-09-15 ENCOUNTER — Ambulatory Visit: Admit: 2022-09-15 | Discharge: 2022-09-16 | Payer: MEDICARE

## 2022-09-15 DIAGNOSIS — M1990 Unspecified osteoarthritis, unspecified site: Secondary | ICD-10-CM

## 2022-09-15 DIAGNOSIS — I10 Essential (primary) hypertension: Secondary | ICD-10-CM

## 2022-09-15 DIAGNOSIS — K2289 Esophageal dilatation: Secondary | ICD-10-CM

## 2022-09-15 DIAGNOSIS — I2510 Atherosclerotic heart disease of native coronary artery without angina pectoris: Secondary | ICD-10-CM

## 2022-09-15 DIAGNOSIS — Q396 Congenital diverticulum of esophagus: Secondary | ICD-10-CM

## 2022-09-15 DIAGNOSIS — E785 Hyperlipidemia, unspecified: Secondary | ICD-10-CM

## 2022-09-15 DIAGNOSIS — G459 Transient cerebral ischemic attack, unspecified: Secondary | ICD-10-CM

## 2022-09-15 DIAGNOSIS — Z951 Presence of aortocoronary bypass graft: Secondary | ICD-10-CM

## 2022-09-15 DIAGNOSIS — K219 Gastro-esophageal reflux disease without esophagitis: Secondary | ICD-10-CM

## 2022-09-15 DIAGNOSIS — I2699 Other pulmonary embolism without acute cor pulmonale: Secondary | ICD-10-CM

## 2022-09-15 DIAGNOSIS — E119 Type 2 diabetes mellitus without complications: Secondary | ICD-10-CM

## 2022-09-15 NOTE — Patient Instructions
If you have any questions, please contact Sarah, Castiel Lauricella or Whitney at [phone] (M-F 8a-4:30p)  After hours, you may call [phone] (Evenings/Weekends/Holidays)

## 2022-09-16 DIAGNOSIS — E785 Hyperlipidemia, unspecified: Secondary | ICD-10-CM

## 2022-09-16 DIAGNOSIS — E119 Type 2 diabetes mellitus without complications: Secondary | ICD-10-CM

## 2022-10-15 ENCOUNTER — Encounter: Admit: 2022-10-15 | Discharge: 2022-10-15 | Payer: MEDICARE

## 2022-10-15 ENCOUNTER — Ambulatory Visit: Admit: 2022-10-15 | Discharge: 2022-10-15 | Payer: MEDICARE

## 2022-10-15 DIAGNOSIS — G459 Transient cerebral ischemic attack, unspecified: Secondary | ICD-10-CM

## 2022-10-15 DIAGNOSIS — I493 Ventricular premature depolarization: Secondary | ICD-10-CM

## 2022-10-15 DIAGNOSIS — I10 Essential (primary) hypertension: Secondary | ICD-10-CM

## 2022-10-15 DIAGNOSIS — E119 Type 2 diabetes mellitus without complications: Secondary | ICD-10-CM

## 2022-10-15 DIAGNOSIS — K219 Gastro-esophageal reflux disease without esophagitis: Secondary | ICD-10-CM

## 2022-10-15 DIAGNOSIS — M1990 Unspecified osteoarthritis, unspecified site: Secondary | ICD-10-CM

## 2022-10-15 DIAGNOSIS — R0989 Other specified symptoms and signs involving the circulatory and respiratory systems: Secondary | ICD-10-CM

## 2022-10-15 DIAGNOSIS — I2510 Atherosclerotic heart disease of native coronary artery without angina pectoris: Secondary | ICD-10-CM

## 2022-10-15 DIAGNOSIS — I2699 Other pulmonary embolism without acute cor pulmonale: Secondary | ICD-10-CM

## 2022-10-15 DIAGNOSIS — E785 Hyperlipidemia, unspecified: Secondary | ICD-10-CM

## 2022-10-15 NOTE — Progress Notes
I have served as the witness to ambulatory cardiac monitor placement. I have reviewed the vendor and serial number of the monitor provided to [facility] and verified that the information documented in the ambulatory monitor flowsheet is accurate and complete.     Catarina Hartshorn

## 2022-10-15 NOTE — Patient Instructions
Ziopatch for 3 days.  If results are good no need to follow up with [facility] Team.      Information About your Zio XT Patch  The Zio XT Patch is a heart monitor, which captures all your heartbeats. You are being prescribed the Zio XT Patch to help your doctor learn if you have any abnormal heart rhythms.    Because the Zio XT Patch captures every heartbeat whether you are awake or asleep, and is prescribed for up to 14 days, your doctor receives a lot more information from the ZIO XT Patch than from many other types of heart monitors.    Once you're done wearing the patch, you mail it to [facility] where your heart information is analyzed by specially trained clinical staff, and a report is sent to your doctor, who then decides how best to care for you.    Patient Instructions      In the first 24 hours after the Zio XT Patch application, the patch is bonding to your skin:   Please avoid any activities which cause sweating.    Please avoid bathing.    This allows the patch to bond with your skin during the first 24 hours    If your skin is moist, pat your skin dry with a dry towel or cloth.       After the first 24 hours of wearing the patch:     You may resume activity, including moderate exercise.     For more information, visit [email or web address]. If you have any questions, please call [employer] Customer Service at [phone], available 24 hours/7 days a week.

## 2022-10-15 NOTE — Progress Notes
Date of Service: 10/15/2022    Parker Ramos is a 82 y.o. male.       HPI     Parker Ramos was seen in the office today in electrophysiology follow up. He is typically followed by Dr. Arna Medici and saw Dr. Naoma Diener as a new patient on 03/31/2022 regarding PVCs.    As you may know, he is a 82 y.o. male, with past medical history including frequent PVCs, h/o TIA, CAD s/p CABG (2007), primary hypertension, hyperlipidemia and T2DM.      72-hour Holter monitor from January 2024 revealed a PVC burden of 25.5%.  He was initiated on mexiletine.  Repeat event monitor from 04/07/2022 revealed no improvement of PVC burden (25.2%).  He was referred to Dr. Wallene Huh to undergo PVC ablation which occurred on 06/14/2022.  He underwent successful ablation of his PVCs from the LVOT to, under the LCC.  Post procedure he was continued on metoprolol and initiated on aspirin 81 mg daily for 4 weeks.  His mexiletine was discontinued.    Today, he presents for 84-month follow-up. Parker Ramos was asymptomatic, with the PVCs initially identified during a monitoring session ordered by his primary care physician. The patient underwent a 72-hour monitor, which revealed a Swartzlander burden of PVCs. Despite a trial of mexiletine, a repeat monitor showed no improvement in PVC burden, leading to the decision for ablation.      Parker Ramos reports no complications post-ablation, except for a hematoma in the groin, which was managed conservatively. He apparently did re-bleed from his groin as he was getting out of bed the first time requiring longer bed rest. Otherwise he healed well.     The patient's daughter noted an improvement in the patient's energy levels post-ablation, suggesting a possible positive impact of the procedure on his quality of life. 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  It was either a baseline recording or random recording.  The patient's monitor demonstrated sinus rhythm with sinus bradycardia and frequent PVCs of different morphology with, at times, couplets or bigeminal rhythm.  There also is separate ventricular beats noted at a rate of about 40-44 beats per minute.  These were followed by PVCs and it appears to be competing with the sinus rhythm, maybe due to increased refractory period after the PVC.  No  symptoms reported during these beats.      CAD (coronary artery disease) 06/17/2008     A.  11/02/2005 adenosine thallium stress test abnormal with reversible apical anterior perfusion abnormality. Abnormal ECG.  EF 60%.  b.     11/05/2005 cardiac cath left main normal.  LAD tubular 60 percent proximal stenosis and first diagonal 80 percent ostial stenosis.  Left   circumflex 80 percent ostial stenosis followed by 30 percent mid stenosis.  RCA mid 70 percent stenosis. PLV moderate stenosis.  EF  55 percent.  Mild apical lateral hypokinesis.  c.     11/08/2005 coronary artery bypass with LIMA to LAD, a free RIMA as T-graft off LIMA to diagonal, and then sequential to obtuse marginal  branch of circumflex. Separate SVG to right PDA.  D. 06/06/08: Lexicon stress Spect- [facility]: mild intensity ischemia involving portions of the Brisco anterolateral and lateral wall. EF 55%  E. 06/20/08: Cardiac cath at : LM- mild plaque, LAD- Prox-mid 80-99%, LCx: 80 % proximal/ostial circumflex, which spans the takeoff of the first  small Hoefer obtuse marginal branch. 60 % midportion of the circumflex. RCA: 100 % occluded distally, diffuse up to 50 % stenosis in the  proximal and mid segments of RCA.EF 55%. Patent LIMA to LAD, Patent SVG to PDA. 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       Separate SVG to right PDA.        Type II diabetes mellitus (HCC) 06/17/2008    HTN (hypertension) 06/17/2008    HLD (hyperlipidemia) 06/17/2008         Review of Systems   Constitutional: Negative.   HENT: Negative.     Eyes: Negative.    Cardiovascular: Negative.    Respiratory: Negative.     Endocrine: Negative.    Hematologic/Lymphatic: Negative.    Skin: Negative.    Musculoskeletal: Negative.    Gastrointestinal: Negative.    Genitourinary: Negative.    Neurological: Negative.    Psychiatric/Behavioral: Negative.     Allergic/Immunologic: Negative.        Physical Exam   Constitutional: He appears well-developed.   HENT:   Head: Normocephalic and atraumatic.   Eyes: Pupils are equal, round, and reactive to light.   Cardiovascular: Normal rate, regular rhythm and normal heart sounds.   Pulmonary/Chest: Effort normal and breath sounds normal.   Musculoskeletal:         General: Normal range of motion.      Cervical back: Normal range of motion.   Neurological: He is alert and oriented to person, place, and time.   Skin: Skin is warm and dry.     Cardiovascular Studies  Preliminary ECG reveals normal sinus rhythm at 60 bpm    Cardiovascular Health Factors  Vitals BP Readings from Last 3 Encounters: 10/15/22 127/57   09/15/22 130/62   07/14/22 (!) 166/77     Wt Readings from Last 3 Encounters:   10/15/22 84.4 kg (186 lb)   09/15/22 84.2 kg (185 lb 9.6 oz)   07/11/22 80.6 kg (177 lb 11.1 oz)     BMI Readings from Last 3 Encounters:   10/15/22 28.28 kg/m?   09/15/22 28.22 kg/m?   07/11/22 27.02 kg/m?  Smoking Social History     Tobacco Use   Smoking Status Former    Types: Cigarettes   Smokeless Tobacco Former    Types: Chew    Quit date: 02/01/1973      Lipid Profile Cholesterol   Date Value Ref Range Status   01/13/2022 111  Final     HDL   Date Value Ref Range Status   01/13/2022 41  Final     LDL   Date Value Ref Range Status   01/13/2022 52  Final     Triglycerides   Date Value Ref Range Status   01/13/2022 90  Final      Blood Sugar Hemoglobin A1C   Date Value Ref Range Status   07/11/2022 8.2 (H) 4.0 - 5.7 % Final     Comment:     The ADA recommends that most patients with type 1 and type 2 diabetes maintain   an A1c level <7%.       Glucose   Date Value Ref Range Status   07/14/2022 145 (H) 70 - 100 MG/DL Final   44/02/270 536 (H) 70 - 100 MG/DL Final   64/40/3474 259 (H) 70 - 100 MG/DL Final     Glucose, POC   Date Value Ref Range Status   07/13/2022 142 (H) 70 - 100 MG/DL Final   56/38/7564 332 (H) 70 - 100 MG/DL Final   95/18/8416 606 (H) 70 - 100 MG/DL Final          Problems Addressed Today  Encounter Diagnoses   Name Primary?    Cardiovascular symptoms Yes    Frequent PVCs        Assessment and Plan     #Frequent premature Ventricular Contractions (PVCs)  Asymptomatic frequent PVCs previously identified on a monitor with 25.5% burden.  No improvement on mexiletine.  He underwent successful LVOT PVC ablation with Dr. Wallene Huh.  Mexiletine was discontinued. Currently on metoprolol. No current symptoms suggestive of PVC recurrence.  -Order 72-hour monitor to assess PVC burden post-ablation.    #Follow-up  -No need to follow-up with EP at this time.  -Follow-up with Dr. Justice Britain in October.    It was a pleasure seeing Parker Ramos in follow-up today.  Thank you for allowing me to participate in the care of this patient.    Levonne Hubert APRN          Current Medications (including today's revisions)   acetaminophen SR (TYLENOL) 650 mg tablet Take one tablet by mouth every 6 hours as needed for Pain.    amLODIPine (NORVASC) 5 mg tablet Take one tablet by mouth daily. Indications: Baudoin blood pressure    amoxicillin (AMOXIL) 500 mg capsule Take four capsules by mouth as Needed. Take before dental procedures    artificial tears multi dose drop ophthalmic solution Apply one drop to both eyes as Needed for Dry Eyes.    atorvastatin (LIPITOR) 40 mg tablet TAKE 1 TABLET EVERY DAY (Patient taking differently: Take one-half tablet by mouth daily.)    calcium carbonate (TUMS PO) Take 1 tablet by mouth as Needed.    CHOLEcalciferoL (vitamin D3) (VITAMIN D3) 50 mcg (2,000 unit) tablet Take one tablet by mouth daily.    clopiDOGrel (PLAVIX) 75 mg tablet Take one tablet by mouth daily.      cyanocobalamin (vitamin B-12) (RUBRAMIN PC) 1,000 mcg/mL injection solution Inject  into the muscle every 30 days.    ezetimibe (ZETIA) 10 mg tablet TAKE 1 TABLET  EVERY DAY    glimepiride (AMARYL) 2 mg tablet Take one tablet by mouth twice daily.    INVOKANA 300 mg tablet Take one tablet by mouth daily with breakfast.    JANUVIA 100 mg tablet Take one tablet by mouth daily.    levothyroxine (SYNTHROID) 25 mcg tablet Take one tablet by mouth daily.    metformin (GLUCOPHAGE) 1,000 mg tablet Take 1 Tab by mouth Twice Daily With Meals. DO NOT RESUME UNTIL THE EVENING OF MAY 20TH    metoprolol tartrate 25 mg tablet TAKE 1 TABLET TWICE DAILY    nitroglycerin (NITROSTAT) 0.4 mg tablet Place 1 Tab under tongue every 5 minutes as needed for Chest Pain.    pantoprazole DR (PROTONIX) 40 mg tablet Take one tablet by mouth daily. Indications: gastroesophageal reflux disease          Total Time Today was 40 minutes in the following activities: Preparing to see the patient, Obtaining and/or reviewing separately obtained history, Performing a medically appropriate examination and/or evaluation, Counseling and educating the patient/family/caregiver, Ordering medications, tests, or procedures, Documenting clinical information in the electronic or other health record, and Independently interpreting results (not separately reported) and communicating results to the patient/family/caregiver

## 2022-10-15 NOTE — Progress Notes
Ambulatory (External) Cardiac Monitor Enrollment Record     Placement Location: Clinic Placement  Clinic Location: [facility]  Vendor: [employer] ([employer])  Mobile Cardiac Telemetry (MCOT/MCT)?: No  Duration of Monitor (in days): 3  Monitor Diagnosis: PVC (premature ventricular contraction) (I49.3)  No data recordedOrdering Provider: Wyvonnia Lora, APRN-NP  AMB Monitor Serial Number: [id number]  AMB Monitor Expiration Date: 02/19/23      Start Time and Date: 10/15/22 3:57 PM   Patient Name: Parker Ramos  DOB: [id number]  MRN: [id number]  Sex: male  Mobile Phone Number: [phone] (mobile)  Home Phone Number: [phone]  Patient Address: [address] [place] [address]  Insurance Coverage: MEDICARE PART A AND B  Insurance ID: [id number]  Insurance Group #:   Insurance Subscriber: Krenz,Fleet L  Implanted Cardiac Device Information: No results found for: [email or web address]      Patient instructed to contact company phone number on the monitor box with questions regarding billing, placement, troubleshooting.     Kennieth Francois    ____________________________________________________________    Clinic Staff:    Complete additional steps for documentation double check/Co-Sign.  In Follow-up, send chart upon closing encounter to [facility] AMBULATORY MONITORS    HRM Ambulatory Monitoring Team:  Schedule on appropriate template and check-in.   Clinic Placement Schedule on clinic location [facility] schedule   Home Enrollment Schedule on Home Enrollment schedule ([facility])   Given to patient in clinic for self-placement Schedule on Home Enrollment schedule ([facility])   Inpatient Schedule on [place] [facility] AMBULATORY MONITORING template   2. Please enroll with appropriate vendor.

## 2022-11-11 ENCOUNTER — Encounter: Admit: 2022-11-11 | Discharge: 2022-11-11 | Payer: MEDICARE

## 2022-11-11 NOTE — Telephone Encounter
Called and spoke patients daughter.  Reviewed BT recs.  She agreed to plan and verbalized understanding.

## 2022-11-11 NOTE — Telephone Encounter
Wyvonnia Lora, APRN-NP  [facility] Nurse Ep Team C  Let patient know that EVM revealed a 1.3% PVC burden!  No change in current medication regimen. Continue to follow with Dr. Arna Medici.

## 2022-11-22 ENCOUNTER — Encounter: Admit: 2022-11-22 | Discharge: 2022-11-22 | Payer: MEDICARE

## 2022-11-24 ENCOUNTER — Encounter: Admit: 2022-11-24 | Discharge: 2022-11-24 | Payer: MEDICARE

## 2022-11-25 ENCOUNTER — Encounter: Admit: 2022-11-25 | Discharge: 2022-11-25 | Payer: MEDICARE

## 2022-11-25 ENCOUNTER — Ambulatory Visit: Admit: 2022-11-25 | Discharge: 2022-11-25 | Payer: MEDICARE

## 2022-11-25 DIAGNOSIS — I2699 Other pulmonary embolism without acute cor pulmonale: Secondary | ICD-10-CM

## 2022-11-25 DIAGNOSIS — E119 Type 2 diabetes mellitus without complications: Secondary | ICD-10-CM

## 2022-11-25 DIAGNOSIS — M1990 Unspecified osteoarthritis, unspecified site: Secondary | ICD-10-CM

## 2022-11-25 DIAGNOSIS — I2510 Atherosclerotic heart disease of native coronary artery without angina pectoris: Secondary | ICD-10-CM

## 2022-11-25 DIAGNOSIS — I10 Essential (primary) hypertension: Secondary | ICD-10-CM

## 2022-11-25 DIAGNOSIS — E785 Hyperlipidemia, unspecified: Secondary | ICD-10-CM

## 2022-11-25 DIAGNOSIS — Z951 Presence of aortocoronary bypass graft: Secondary | ICD-10-CM

## 2022-11-25 DIAGNOSIS — I493 Ventricular premature depolarization: Secondary | ICD-10-CM

## 2022-11-25 DIAGNOSIS — K219 Gastro-esophageal reflux disease without esophagitis: Secondary | ICD-10-CM

## 2022-11-25 DIAGNOSIS — G459 Transient cerebral ischemic attack, unspecified: Secondary | ICD-10-CM

## 2022-11-25 NOTE — Progress Notes
Date of Service: 11/25/2022    Parker Ramos is a 82 y.o. male.       HPI    Parker Ramos is followed for coronary artery disease, hypertension, diabetes mellitus and hyperlipidemia.  He was found to have a [place] PVC burden and on 06/14/2022 he underwent PVC ablation with location in the left ventricular outflow tract.  Almost all of his current symptoms are related to arthritis.  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  He has seen pain therapy and has also seen a rheumatologist at [facility] hospital.  He gets a little relief from his tramadol.  His exercise tolerance continues to slowly worsen due to his arthritis.  It is apparent that he has difficulty transferring and ambulating.  From a cardiovascular perspective, the patient reports that he has been stable and he reports no angina, current congestive symptoms, palpitations, sensation of sustained forceful heart pounding, lightheadedness or syncope. The patient reports no myalgias, claudication, strokelike symptoms or bleeding abnormalities. His blood sugars vary with his diet.  He does not believe that he has diabetic neuropathy, retinopathy or nephropathy.      Historically, in the fall of 2007 Parker Ramos initially presented with symptoms of lightheadedness and a rapid heart beat. A stress test was performed and   was abnormal. He then underwent coronary angiography which revealed severe three-vessel coronary artery disease. He   underwent cardiac bypass surgery receiving a left internal mammary artery graft to the left anterior descending along   with a free right internal mammary artery T-graft off the LIMA to the diagonal and obtuse marginal branch of the   circumflex coronary artery. He also received a reverse saphenous vein graft to the posterior descending branch of the   right coronary artery. This was performed on November 08, 2005. The surgery was tolerated well and was uncomplicated.    In 2010 Parker Ramos underwent cardiac evaluation prior to left knee surgery. Coronary angiography was performed on 06/18/08 and revealed:    Severe multivessel native coronary artery disease.  Patent saphenous vein graft to PDA.  Patent LIMA to LAD with patent jump segments to the diagonal and obtuse marginal.  Normal left ventricular systolic function.      Parker Ramos developed left facial numbness rather abruptly on 06/07/11 at approximately 10:45 AM. It lasted until approximately 3:30 PM and it was not associated with motor abnormalities, visual abnormalities, hearing abnormalities, cognitive abnormalities or difficulty with speech. The patient reports that a CT scan, carotid duplex and MRA were performed and that a transient ischemic attack was suspected. He reports that he was seen by a neurologist who placed him on Plavix instead of aspirin. 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 He reports that he developed dyspnea on 04/14/12 and was seen in the Emergency Room. He was noted to have pulmonary vascular congestion and reportedly was administered IV furosemide with improvement and his hydrochlorothiazide was restarted. 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 He reports that he developed hypotension after receiving Flomax and spent a night in the intensive care unit but reports no other complications such as chest discomfort, congestive heart failure or arrhythmias. On July 16, 2017 Parker Ramos woke up with mid abdominal pain and nausea.  He had occasional episodes of emesis.  The patient reports that he was evaluated on 07/18/2017 and  was sent to the emergency room.  There is no evidence for an acute coronary syndrome.  The source of the discomfort was thought to be abdominal and he was placed on an H2 blocker (famotidine) with some gradual improvement.        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  It was either a baseline recording or random recording.  The patient's monitor demonstrated sinus rhythm with sinus bradycardia and frequent PVCs of different morphology with, at times, couplets or bigeminal rhythm.  There also is separate ventricular beats noted at a rate of about 40-44 beats per minute.  These were followed by PVCs and it appears to be competing with the sinus rhythm, maybe due to increased refractory period after the PVC.  No symptoms reported during these beats.      CAD (coronary artery disease) 06/17/2008     A.  11/02/2005 adenosine thallium stress test abnormal with reversible apical anterior perfusion abnormality. Abnormal ECG.  EF 60%.  b.     11/05/2005 cardiac cath left main normal.  LAD tubular 60 percent proximal stenosis and first diagonal 80 percent ostial stenosis.  Left   circumflex 80 percent ostial stenosis followed by 30 percent mid stenosis.  RCA mid 70 percent stenosis. PLV moderate stenosis.  EF  55 percent.  Mild apical lateral hypokinesis.  c.     11/08/2005 coronary artery bypass with LIMA to LAD, a free RIMA as T-graft off LIMA to diagonal, and then sequential to obtuse marginal  branch of circumflex. Separate SVG to right PDA.  D. 06/06/08: [facility] stress Spect- [facility]: mild intensity ischemia involving portions of the [place] anterolateral and lateral wall. EF 55%  E. 06/20/08: Cardiac cath at [facility]: LM- mild plaque, LAD- Prox-mid 80-99%, LCx: 80 % proximal/ostial circumflex, which spans the takeoff of the first  small [place] obtuse marginal branch. 60 % midportion of the circumflex. RCA: 100 % occluded distally, diffuse up to 50 % stenosis in the  proximal and mid segments of RCA.EF 55%. Patent LIMA to LAD, Patent SVG to PDA. 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       Separate SVG to right PDA.        Type II diabetes mellitus (HCC) 06/17/2008  HTN (hypertension) 06/17/2008    HLD (hyperlipidemia) 06/17/2008         Review of Systems   Constitutional: Negative.   HENT: Negative.     Eyes: Negative.    Cardiovascular: Negative.    Respiratory: Negative.     Endocrine: Negative.    Hematologic/Lymphatic: Negative.    Skin: Negative.    Musculoskeletal: Negative.    Gastrointestinal: Negative.    Genitourinary: Negative.    Neurological: Negative.    Psychiatric/Behavioral: Negative.     Allergic/Immunologic: Negative.        Physical Exam  GENERAL: The patient is well developed, well nourished, resting comfortably and in no distress.   HEENT: No abnormalities of the visible oro-nasopharynx, conjunctiva or sclera are noted.  NECK: There is no jugular venous distension. Carotids are palpable and without bruits. There is no thyroid enlargement.  Chest: Lung fields are clear to auscultation. There are no wheezes or crackles.  CV: There is a regular rhythm. The first and second heart sounds are normal. There are no murmurs, gallops or rubs.  ABD: The abdomen is soft and supple with normal bowel sounds. There is no hepatosplenomegaly, ascites, tenderness, masses or bruits.  Neuro: There are no focal motor defects. Ambulation is slow and stooped.  Cognitive function appears normal.  He has difficulty transferring due to arthritis in multiple joints.  Ext: There is no edema or evidence of deep vein thrombosis. Peripheral pulses are satisfactory.  Difficulty with mobility from multiple arthritic joints is apparent.  Marked kyphosis.  SKIN: There are no rashes and no cellulitis  PSYCH: The patient is calm, rationale and oriented.    Cardiovascular Studies  A twelve-lead ECG obtained on October 15, 2022 reveals normal sinus rhythm with a heart rate of 60 bpm.  There is no evidence of myocardial ischemia or infarction.  Labs from 05/26/2022 revealed total cholesterol = 111, triglycerides = 87, HDL = 48 LDL cholesterol = 54 mg/dL.  His creatinine was 1.02 mg/dL and potassium 4.9 mmol/L.  Event monitor 10/15/2022 following ablation:  Interpretation Summary  The patient was monitored for a total of 3 days and 1 hours on 10/15/2022.  During that time, the minimum heart rate recorded was 49 bpm with no significant bradycardic runs.  There were no significant pauses.  The maximum heart rate recorded was 108 bpm with an average heart rate of 67 bpm.  There were occasional ventricular ectopic beats seen.  Total PVC burden comprised 1.3% of total beats.  There were occasional supraventricular ectopic beats present with 14 SVT events.  The longest SVT episode lasted 8 beats at an average rate of 107 bpm.  No symptoms were reported with this event.    A diary did accompany this recording period.  No symptoms were reported.  No other arrhythmias were seen.  Impression: 3-day monitor associated with sinus rhythm, occasional atrial/ventricular ectopy, and transient asymptomatic SVT.    Echo Doppler 03/16/2022:    Interpretation Summary  The left ventricle is normal in size with mildly reduced LV systolic function, visually estimated to be 45% with diffuse hypokinesis and abnormal septal motion.  Grade 1 (mild) diastolic dysfunction.  The right ventricle is normal in size with moderately reduced systolic function.  Left atrium is moderately dilated.  Mild right atrial dilation.  Mild to moderate tricuspid regurgitation.  Otherwise no hemodynamically significant valvular stenosis or regurgitation is identified.  No pericardial effusion.  Estimated PASP is 44 mmHg.  Elevated estimated CVP at 5-10 mmHg.  No prior studies are currently available for direct visual comparison.    Cardiovascular Health Factors  Vitals BP Readings from Last 3 Encounters:   11/25/22 119/67   10/15/22 127/57   09/15/22 130/62     Wt Readings from Last 3 Encounters:   11/25/22 84.5 kg (186 lb 3.2 oz)   10/15/22 84.4 kg (186 lb)   09/15/22 84.2 kg (185 lb 9.6 oz)     BMI Readings from Last 3 Encounters:   11/25/22 28.31 kg/m?   10/15/22 28.28 kg/m?   09/15/22 28.22 kg/m?      Smoking Social History     Tobacco Use   Smoking Status Former    Types: Cigarettes   Smokeless Tobacco Former    Types: Chew    Quit date: 02/01/1973      Lipid Profile Cholesterol   Date Value Ref Range Status   01/13/2022 111  Final     HDL   Date Value Ref Range Status   01/13/2022 41  Final     LDL   Date Value Ref Range Status   01/13/2022 52  Final     Triglycerides   Date Value Ref Range Status   01/13/2022 90  Final      Blood Sugar Hemoglobin A1C   Date Value Ref Range Status   07/11/2022 8.2 (H) 4.0 - 5.7 % Final     Comment:     The ADA recommends that most patients with type 1 and type 2 diabetes maintain   an A1c level <7%.       Glucose   Date Value Ref Range Status   11/10/2022 206 (H) 70 - 105 Final   07/14/2022 145 (H) 70 - 100 MG/DL Final   16/11/9602 540 (H) 70 - 100 MG/DL Final     Glucose, POC   Date Value Ref Range Status   07/13/2022 142 (H) 70 - 100 MG/DL Final   98/12/9145 829 (H) 70 - 100 MG/DL Final   56/21/3086 578 (H) 70 - 100 MG/DL Final          Problems Addressed Today  Premature ventricular contractions. Coronary artery disease.  Hypercholesterolemia.    Assessment and Plan     Parker Ramos appears to be stable from a cardiovascular perspective.  He reports no angina or congestive symptoms.  His blood pressure and LDL cholesterol appear well-controlled.  Most of his medical problems are currently related to his peripheral and back arthritis.  Cardiovascular risk factor modification was discussed in great detail.  I have asked him to return for follow-up in 6 months time. The total time spent during this interview and exam with preparation and chart review was 30 minutes.           Current Medications (including today's revisions)   acetaminophen SR (TYLENOL) 650 mg tablet Take one tablet by mouth every 6 hours as needed for Pain.    amLODIPine (NORVASC) 5 mg tablet Take one tablet by mouth daily. Indications: Flath blood pressure    amoxicillin (AMOXIL) 500 mg capsule Take four capsules by mouth as Needed. Take before dental procedures    atorvastatin (LIPITOR) 40 mg tablet TAKE 1 TABLET EVERY DAY (Patient taking differently: Take one-half tablet by mouth daily.)    calcium carbonate (TUMS PO) Take 1 tablet by mouth as Needed.    CHOLEcalciferoL (vitamin D3) (VITAMIN D3) 50 mcg (2,000 unit) tablet Take one tablet by mouth daily.    clopiDOGrel (PLAVIX) 75 mg tablet  Take one tablet by mouth daily.      empagliflozin (JARDIANCE) 25 mg tablet Take one tablet by mouth daily.    ezetimibe (ZETIA) 10 mg tablet TAKE 1 TABLET EVERY DAY    glimepiride (AMARYL) 2 mg tablet Take one tablet by mouth twice daily.    JANUVIA 100 mg tablet Take one tablet by mouth daily.    levothyroxine (SYNTHROID) 25 mcg tablet Take one tablet by mouth daily.    metformin (GLUCOPHAGE) 1,000 mg tablet Take 1 Tab by mouth Twice Daily With Meals. DO NOT RESUME UNTIL THE EVENING OF MAY 20TH    metoprolol tartrate 25 mg tablet TAKE 1 TABLET TWICE DAILY    nitroglycerin (NITROSTAT) 0.4 mg tablet Place 1 Tab under tongue every 5 minutes as needed for Chest Pain.    pantoprazole DR (PROTONIX) 40 mg tablet Take one tablet by mouth daily. Indications: gastroesophageal reflux disease    traMADoL (ULTRAM) 50 mg tablet Take one tablet by mouth every 6 hours as needed.

## 2022-11-25 NOTE — Patient Instructions
Thank you for visiting our office today.    We would like to make the following medication adjustments:  NONE       Otherwise continue the same medications as you have been doing.          We will be pursuing the following tests after your appointment today:            We will plan to see you back in 6 months.  Please call us in the meantime with any questions or concerns.        Please allow 5-7 business days for our providers to review your results. All normal results will go to MyChart. If you do not have Mychart, it is strongly recommended to get this so you can easily view all your results. If you do not have mychart, we will attempt to call you once with normal lab and testing results. If we cannot reach you by phone with normal results, we will send you a letter.  If you have not heard the results of your testing after one week please give [place] a call.       Your Cardiovascular Medicine [place] Team Brett Canales, Pilar Jarvis, Shawna Orleans, and [place])  phone number is [phone].

## 2022-12-27 ENCOUNTER — Encounter: Admit: 2022-12-27 | Discharge: 2022-12-27 | Payer: MEDICARE

## 2023-03-01 ENCOUNTER — Encounter: Admit: 2023-03-01 | Discharge: 2023-03-01 | Payer: MEDICARE

## 2023-03-01 MED ORDER — ATORVASTATIN 40 MG PO TAB
40 mg | ORAL_TABLET | Freq: Every day | 3 refills | Status: AC
Start: 2023-03-01 — End: ?

## 2023-05-30 ENCOUNTER — Encounter: Admit: 2023-05-30 | Discharge: 2023-05-30 | Payer: MEDICARE

## 2023-06-02 ENCOUNTER — Encounter: Admit: 2023-06-02 | Discharge: 2023-06-02 | Payer: MEDICARE

## 2023-06-07 ENCOUNTER — Encounter: Admit: 2023-06-07 | Discharge: 2023-06-07 | Payer: MEDICARE

## 2023-06-07 ENCOUNTER — Ambulatory Visit: Admit: 2023-06-07 | Discharge: 2023-06-08 | Payer: MEDICARE

## 2023-06-07 DIAGNOSIS — I10 Essential (primary) hypertension: Secondary | ICD-10-CM

## 2023-06-07 DIAGNOSIS — I2510 Atherosclerotic heart disease of native coronary artery without angina pectoris: Secondary | ICD-10-CM

## 2023-06-07 DIAGNOSIS — I493 Ventricular premature depolarization: Secondary | ICD-10-CM

## 2023-06-07 DIAGNOSIS — E785 Hyperlipidemia, unspecified: Secondary | ICD-10-CM

## 2023-06-07 MED ORDER — METOPROLOL TARTRATE 25 MG PO TAB
25 mg | ORAL_TABLET | Freq: Two times a day (BID) | ORAL | 3 refills | 90.00000 days | Status: AC
Start: 2023-06-07 — End: ?

## 2023-06-07 NOTE — Progress Notes
 Date of Service: 06/07/2023    Parker Ramos is a 83 y.o. male.       HPI   [name] is followed for coronary artery disease, hypertension, diabetes mellitus and hyperlipidemia.  He was found to have a [place] PVC burden and on 06/14/2022 he underwent PVC ablation with location in the left ventricular outflow tract.  Almost all of his current symptoms are related to arthritis.  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  I am not sure that he is seeking ongoing treatment for his arthritis and it appears that last time he saw a rheumatologist at [facility] was in 2022.  He gets a little relief from his tramadol.  He has difficulty transitioning, although yesterday he indicates that he rode his tractor to cut his grass.  His exercise tolerance continues to slowly worsen due to his arthritis.  He can walk short distances using his walker.  He does not leave the house very often and his daughter helps him with shopping.  From a cardiovascular perspective, the patient reports that he has been stable and he reports no angina, current congestive symptoms, palpitations, sensation of sustained forceful heart pounding, lightheadedness or syncope. The patient reports no myalgias, claudication, strokelike symptoms or bleeding abnormalities. His blood sugars vary with his diet.  He does not believe that he has diabetic neuropathy, retinopathy or nephropathy.      Historically, in the fall of 2007 [name] initially presented with symptoms of lightheadedness and a rapid heart beat. A stress test was performed and   was abnormal. He then underwent coronary angiography which revealed severe three-vessel coronary artery disease. He   underwent cardiac bypass surgery receiving a left internal mammary artery graft to the left anterior descending along   with a free right internal mammary artery T-graft off the LIMA to the diagonal and obtuse marginal branch of the   circumflex coronary artery. He also received a reverse saphenous vein graft to the posterior descending branch of the   right coronary artery. This was performed on November 08, 2005. The surgery was tolerated well and was uncomplicated.    In 2010 [name] underwent cardiac evaluation prior to left knee surgery. Coronary angiography was performed on 06/18/08 and revealed:    Severe multivessel native coronary artery disease.  Patent saphenous vein graft to PDA.  Patent LIMA to LAD with patent jump segments to the diagonal and obtuse marginal.  Normal left ventricular systolic function.      [name] developed left facial numbness rather abruptly on 06/07/11 at approximately 10:45 AM. It lasted until approximately 3:30 PM and it was not associated with motor abnormalities, visual abnormalities, hearing abnormalities, cognitive abnormalities or difficulty with speech. The patient reports that a CT scan, carotid duplex and MRA were performed and that a transient ischemic attack was suspected. He reports that he was seen by a neurologist who placed him on Plavix instead of aspirin. [name] reports that he underwent total right knee arthroplasty on 02/29/12. He reports that he developed dyspnea on 04/14/12 and was seen in the Emergency Room. He was noted to have pulmonary vascular congestion and reportedly was administered IV furosemide with improvement and his hydrochlorothiazide was restarted. [name] does have a history of diabetes mellitus and transient ischemic attack.[name] reports that he underwent right total knee arthroplasty on 06/20/13. He reports that he developed hypotension after receiving Flomax and spent a night in the intensive care unit but  reports no other complications such as chest discomfort, congestive heart failure or arrhythmias. On July 16, 2017 [name] woke up with mid abdominal pain and nausea.  He had occasional episodes of emesis.  The patient reports that he was evaluated on 07/18/2017 and was sent to the emergency room.  There is no evidence for an acute coronary syndrome.  The source of the discomfort was thought to be abdominal and he was placed on an H2 blocker (famotidine) with some gradual improvement.        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  It was either a baseline recording or random recording.  The patient's monitor demonstrated sinus rhythm with sinus bradycardia and frequent PVCs of different morphology with, at times, couplets or bigeminal rhythm.  There also is separate ventricular beats noted at a rate of about 40-44 beats per minute.  These were followed by PVCs and it appears to be competing with the sinus rhythm, maybe due to increased refractory period after the PVC.  No symptoms reported during these beats.      CAD (coronary artery disease) 06/17/2008     A.  11/02/2005 adenosine thallium stress test abnormal with reversible apical anterior perfusion abnormality. Abnormal ECG.  EF 60%.  b.     11/05/2005 cardiac cath left main normal.  LAD tubular 60 percent proximal stenosis and first diagonal 80 percent ostial stenosis.  Left   circumflex 80 percent ostial stenosis followed by 30 percent mid stenosis.  RCA mid 70 percent stenosis. PLV moderate stenosis.  EF  55 percent.  Mild apical lateral hypokinesis.  c.     11/08/2005 coronary artery bypass with LIMA to LAD, a free RIMA as T-graft off LIMA to diagonal, and then sequential to obtuse marginal  branch of circumflex. Separate SVG to right PDA.  D. 06/06/08: [facility] stress Spect- [facility]: mild intensity ischemia involving portions of the [place] anterolateral and lateral wall. EF 55%  E. 06/20/08: Cardiac cath at [facility]: LM- mild plaque, LAD- Prox-mid 80-99%, LCx: 80 % proximal/ostial circumflex, which spans the takeoff of the first  small [place] obtuse marginal branch. 60 % midportion of the circumflex. RCA: 100 % occluded distally, diffuse up to 50 % stenosis in the  proximal and mid segments of RCA.EF 55%. Patent LIMA to LAD, Patent SVG to PDA. 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       Separate SVG to right PDA.        Type II diabetes mellitus (CMS-HCC) 06/17/2008    HTN (hypertension) 06/17/2008    HLD (hyperlipidemia) 06/17/2008         Review of Systems   Constitutional: Negative.   HENT: Negative.     Eyes: Negative.    Cardiovascular: Negative.    Respiratory: Negative.     Endocrine: Negative.    Hematologic/Lymphatic: Negative.    Skin: Negative.    Musculoskeletal: Negative.    Gastrointestinal: Negative.    Genitourinary: Negative.    Neurological: Negative.    Psychiatric/Behavioral: Negative.     Allergic/Immunologic: Negative.        Physical Exam  GENERAL: The patient is well developed, well nourished, resting comfortably and in no distress.   HEENT: No abnormalities of the visible oro-nasopharynx, conjunctiva or sclera are noted.  NECK: There is no jugular venous distension. Carotids are palpable and without bruits. There is no thyroid enlargement.  Chest: Lung fields are clear to auscultation. There are no wheezes or crackles.  CV: There is a regular rhythm. The first and second heart sounds are normal. There are no murmurs, gallops or rubs.  ABD: The abdomen is soft and supple with normal bowel sounds. There is no hepatosplenomegaly, ascites, tenderness, masses or bruits.  Neuro: There are no focal motor defects. Ambulation is slow and stooped.  Cognitive function appears normal.  He has difficulty transferring due to arthritis in multiple joints.  Ext: There is no edema or evidence of deep vein thrombosis. Peripheral pulses are satisfactory.  Difficulty with mobility from multiple arthritic joints is apparent.  Marked kyphosis.  SKIN: There are no rashes and no cellulitis  PSYCH: The patient is calm, rationale and oriented.    Cardiovascular Studies  A twelve-lead ECG obtained on October 15, 2022 reveals normal sinus rhythm with a heart rate of 60 bpm.  There is no evidence of myocardial ischemia or infarction.  Labs from 05/26/2022 revealed total cholesterol = 111, triglycerides = 87, HDL = 48 LDL cholesterol = 54 mg/dL.  His creatinine was 1.02 mg/dL and potassium 4.9 mmol/L.  Event monitor 10/15/2022 following ablation:  Interpretation Summary  The patient was monitored for a total of 3 days and 1 hours on 10/15/2022.  During that time, the minimum heart rate recorded was 49 bpm with no significant bradycardic runs.  There were no significant pauses.  The maximum heart rate recorded was 108 bpm with an average heart rate of 67 bpm.  There were occasional ventricular ectopic beats seen.  Total PVC burden comprised 1.3% of total beats.  There were occasional supraventricular ectopic beats present with 14 SVT events.  The longest SVT episode lasted 8 beats at an average rate of 107 bpm.  No symptoms were reported with this event.    A diary did accompany this recording period.  No symptoms were reported.  No other arrhythmias were seen.  Impression: 3-day monitor associated with sinus rhythm, occasional atrial/ventricular ectopy, and transient asymptomatic SVT.     Echo Doppler 03/16/2022:    Interpretation Summary  The left ventricle is normal in size with mildly reduced LV systolic function, visually estimated to be 45% with diffuse hypokinesis and abnormal septal motion.  Grade 1 (mild) diastolic dysfunction.  The right ventricle is normal in size with  moderately reduced systolic function.  Left atrium is moderately dilated.  Mild right atrial dilation.  Mild to moderate tricuspid regurgitation.  Otherwise no hemodynamically significant valvular stenosis or regurgitation is identified.  No pericardial effusion.  Estimated PASP is 44 mmHg.  Elevated estimated CVP at 5-10 mmHg.  No prior studies are currently available for direct visual comparison.    Cardiovascular Health Factors  Vitals BP Readings from Last 3 Encounters:   06/07/23 122/63   11/25/22 119/67   10/15/22 127/57     Wt Readings from Last 3 Encounters:   06/07/23 85.9 kg (189 lb 6.4 oz)   11/25/22 84.5 kg (186 lb 3.2 oz)   10/15/22 84.4 kg (186 lb)     BMI Readings from Last 3 Encounters:   06/07/23 28.80 kg/m?   11/25/22 28.31 kg/m?   10/15/22 28.28 kg/m?      Smoking Social History     Tobacco Use   Smoking Status Former    Types: Cigarettes   Smokeless Tobacco Former    Types: Chew    Quit date: 02/01/1973      Lipid Profile Cholesterol   Date Value Ref Range Status   04/26/2023 107  Final     HDL   Date Value Ref Range Status   04/26/2023 36 (L) >=40 Final     LDL   Date Value Ref Range Status   04/26/2023 48  Final     Triglycerides   Date Value Ref Range Status   04/26/2023 119  Final      Blood Sugar Hemoglobin A1C   Date Value Ref Range Status   07/11/2022 8.2 (H) 4.0 - 5.7 % Final     Comment:     The ADA recommends that most patients with type 1 and type 2 diabetes maintain   an A1c level <7%.       Glucose   Date Value Ref Range Status   04/26/2023 213 (H) 70 - 105 Final   11/10/2022 206 (H) 70 - 105 Final   07/14/2022 145 (H) 70 - 100 MG/DL Final     Glucose, POC   Date Value Ref Range Status   07/13/2022 142 (H) 70 - 100 MG/DL Final   16/11/9602 540 (H) 70 - 100 MG/DL Final   98/12/9145 829 (H) 70 - 100 MG/DL Final          Problems Addressed Today  Coronary disease.  Hypercholesterolemia.  Hyperlipidemia.  Diabetes.  Premature ventricular contractions.    Assessment and Plan     Mr. Amparano appears to be stable from a cardiovascular perspective.  He reports no angina or congestive symptoms.  His blood pressure and LDL cholesterol appear well-controlled.  Most of his medical problems are currently related to his peripheral and back arthritis.  I strongly encouraged him to obtain ongoing arthritis specially care.  Cardiovascular risk factor modification was discussed in great detail.  I have asked him to return for follow-up in 12 months time. The total time spent during this interview and exam with preparation and chart review was 30 minutes.          Current Medications (including today's revisions)   acetaminophen SR (TYLENOL) 650 mg tablet Take one tablet by mouth every 6 hours as needed for Pain.    amLODIPine (NORVASC) 5 mg tablet Take one tablet by mouth daily. Indications: Hopping blood pressure    amoxicillin (AMOXIL) 500 mg capsule Take four capsules by mouth as Needed. Take before dental  procedures    atorvastatin (LIPITOR) 40 mg tablet TAKE 1 TABLET EVERY DAY    calcium carbonate (TUMS PO) Take 1 tablet by mouth as Needed.    CHOLEcalciferoL (vitamin D3) (VITAMIN D3) 50 mcg (2,000 unit) tablet Take one tablet by mouth daily.    clopiDOGrel (PLAVIX) 75 mg tablet Take one tablet by mouth daily.      empagliflozin (JARDIANCE) 25 mg tablet Take one tablet by mouth daily.    ezetimibe (ZETIA) 10 mg tablet TAKE 1 TABLET EVERY DAY    glimepiride (AMARYL) 2 mg tablet Take one tablet by mouth twice daily.    JANUVIA 100 mg tablet Take one tablet by mouth daily.    levothyroxine (SYNTHROID) 25 mcg tablet Take one tablet by mouth daily.    metformin (GLUCOPHAGE) 1,000 mg tablet Take 1 Tab by mouth Twice Daily With Meals. DO NOT RESUME UNTIL THE EVENING OF MAY 20TH    metoprolol tartrate 25 mg tablet TAKE 1 TABLET TWICE DAILY    nitroglycerin (NITROSTAT) 0.4 mg tablet Place 1 Tab under tongue every 5 minutes as needed for Chest Pain.    pantoprazole DR (PROTONIX) 40 mg tablet Take one tablet by mouth daily. Indications: gastroesophageal reflux disease    traMADoL (ULTRAM) 50 mg tablet Take one tablet by mouth every 6 hours as needed.

## 2023-06-07 NOTE — Patient Instructions
 Thank you for visiting our office today.    We would like to make the following medication adjustments:  NONE       Otherwise continue the same medications as you have been doing.          We will be pursuing the following tests after your appointment today:            We will plan to see you back in 6 months.  Please call us in the meantime with any questions or concerns.        Please allow 5-7 business days for our providers to review your results. All normal results will go to MyChart. If you do not have Mychart, it is strongly recommended to get this so you can easily view all your results. If you do not have mychart, we will attempt to call you once with normal lab and testing results. If we cannot reach you by phone with normal results, we will send you a letter.  If you have not heard the results of your testing after one week please give [place] a call.       Your Cardiovascular [facility] [place] Team Brett Canales, Pilar Jarvis, Shawna Orleans, and [place])  phone number is [phone].

## 2023-08-17 ENCOUNTER — Encounter: Admit: 2023-08-17 | Discharge: 2023-08-17 | Payer: MEDICARE

## 2023-08-17 MED ORDER — EZETIMIBE 10 MG PO TAB
10 mg | ORAL_TABLET | Freq: Every day | ORAL | 3 refills | 90.00000 days | Status: AC
Start: 2023-08-17 — End: ?

## 2023-11-21 ENCOUNTER — Encounter: Admit: 2023-11-21 | Discharge: 2023-11-21 | Payer: MEDICARE
